# Patient Record
Sex: Male | Born: 1950
Health system: Southern US, Community
[De-identification: ages and names within clinical notes are randomized; demographics above are authoritative.]

## PROBLEM LIST (undated history)

## (undated) DIAGNOSIS — E119 Type 2 diabetes mellitus without complications: Secondary | ICD-10-CM

## (undated) DIAGNOSIS — I10 Essential (primary) hypertension: Secondary | ICD-10-CM

## (undated) DIAGNOSIS — E785 Hyperlipidemia, unspecified: Secondary | ICD-10-CM

## (undated) HISTORY — DX: Hyperlipidemia, unspecified: E78.5

## (undated) HISTORY — DX: Essential (primary) hypertension: I10

## (undated) HISTORY — DX: Type 2 diabetes mellitus without complications: E11.9

---

## 2000-12-21 ENCOUNTER — Ambulatory Visit (HOSPITAL_COMMUNITY): Admission: RE | Admit: 2000-12-21 | Discharge: 2000-12-21 | Payer: Self-pay | Admitting: Gastroenterology

## 2005-08-01 ENCOUNTER — Encounter: Admission: RE | Admit: 2005-08-01 | Discharge: 2005-08-01 | Payer: Self-pay | Admitting: Internal Medicine

## 2007-05-26 IMAGING — CT CT PELVIS W/O CM
2 of 4 series · 13 of 32 positions shown, 18 images · IV contrast (agent unspecified)
Comparison: none

CLINICAL DATA: Flank pain and hematuria, assess for renal stones. 
 CT ABDOMEN WITHOUT CONTRAST:
TECHNIQUE: Multidetector CT imaging of the abdomen was performed following the standard protocol without IV contrast.
TECHNIQUE: Multiplanar, multisequence MR imaging of the pelvis was performed following the standard protocol.  No intravenous contrast was administered.

[Series 2: renal stone · axial · 0.65mm/px · z∈[-256,-36]mm · 5 of 66 slices shown, 10 images]
[im 11/66  soft-tissue]
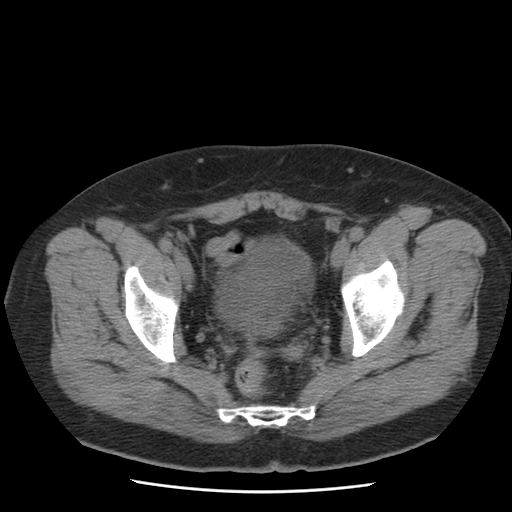
[im 11/66  bone]
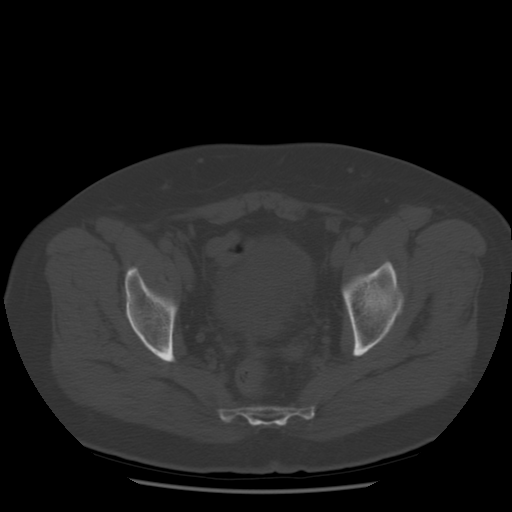
[im 22/66  soft-tissue]
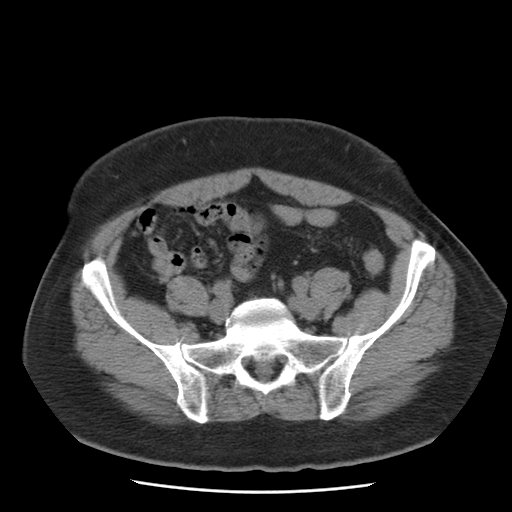
[im 22/66  lung]
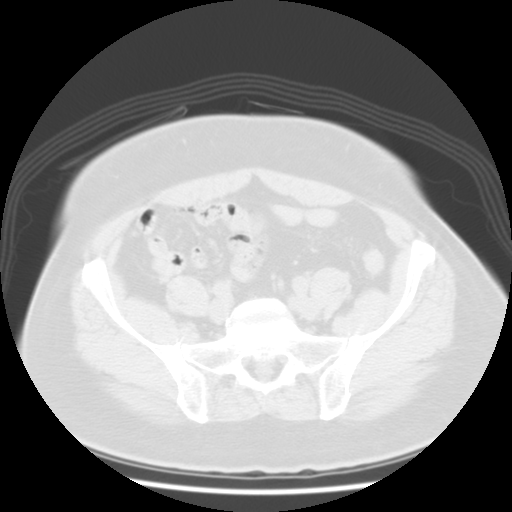
[im 33/66  soft-tissue]
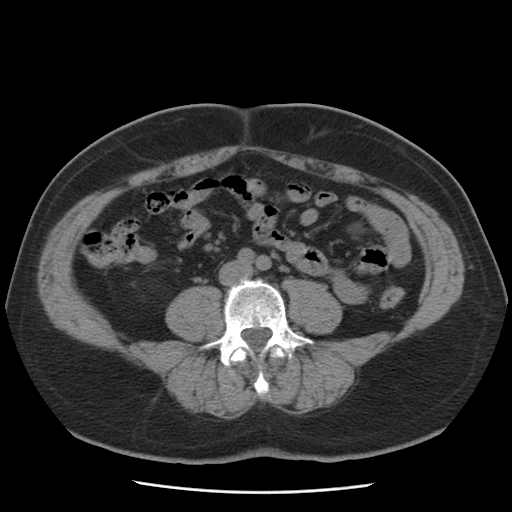
[im 33/66  lung]
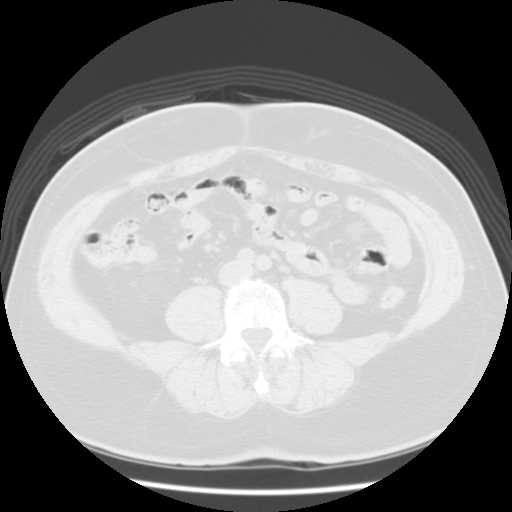
[im 44/66  soft-tissue]
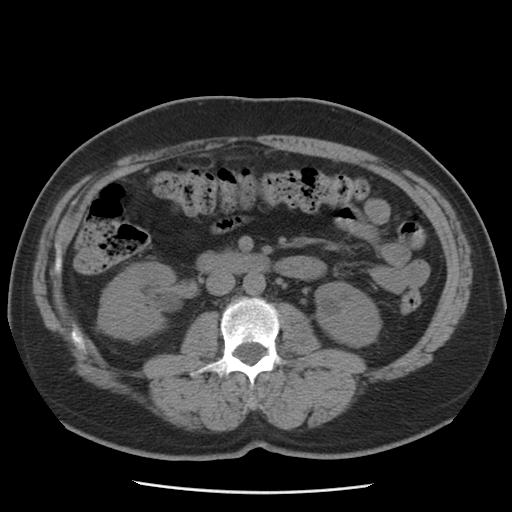
[im 44/66  lung]
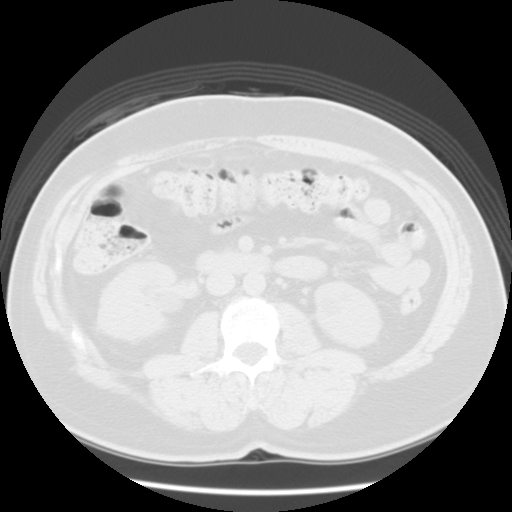
[im 55/66  soft-tissue]
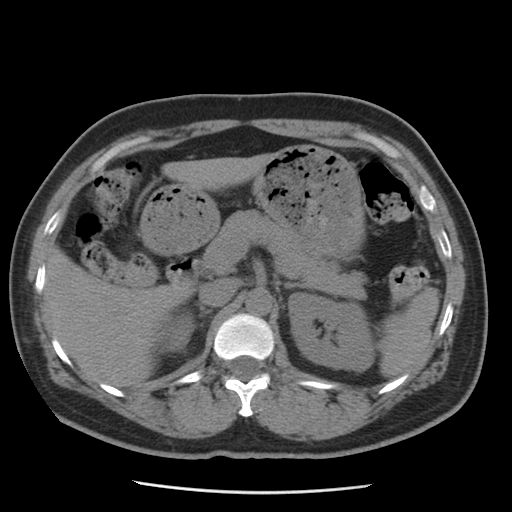
[im 55/66  lung]
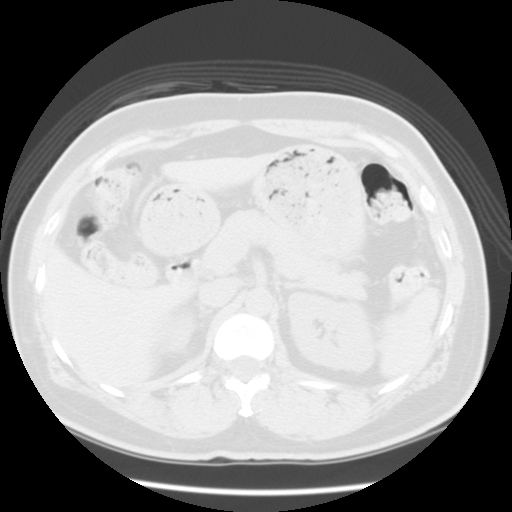

[Series 103: reformatted · sagittal · 0.65mm/px · 8 of 118 slices shown]
[im 10/118  soft-tissue]
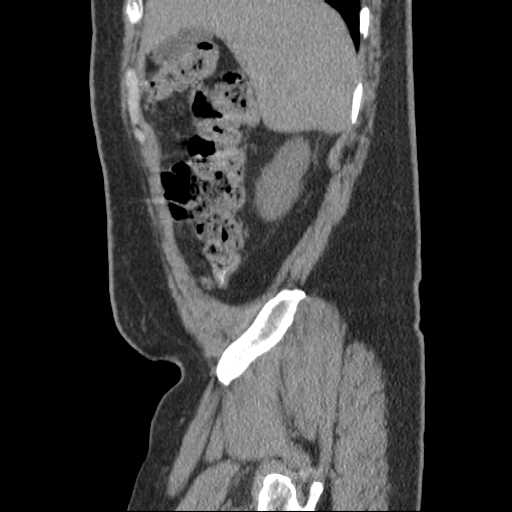
[im 30/118  soft-tissue]
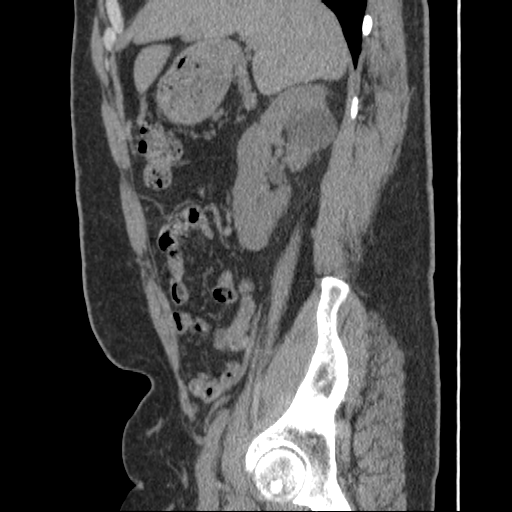
[im 40/118  soft-tissue]
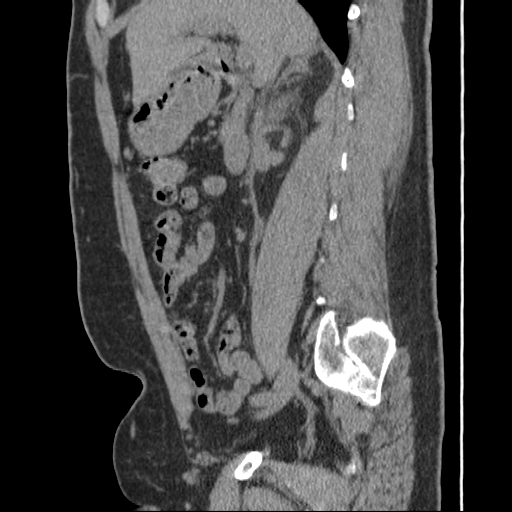
[im 49/118  soft-tissue]
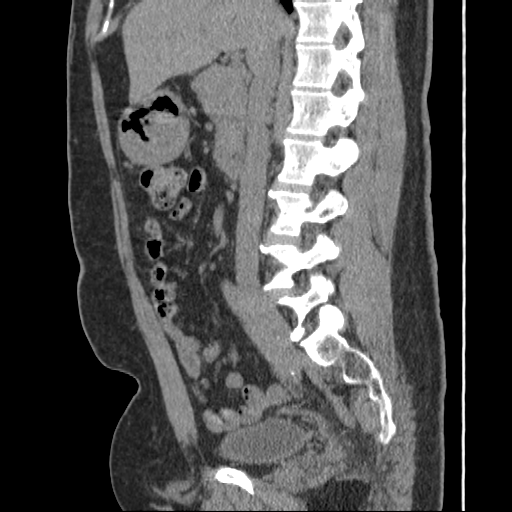
[im 69/118  soft-tissue]
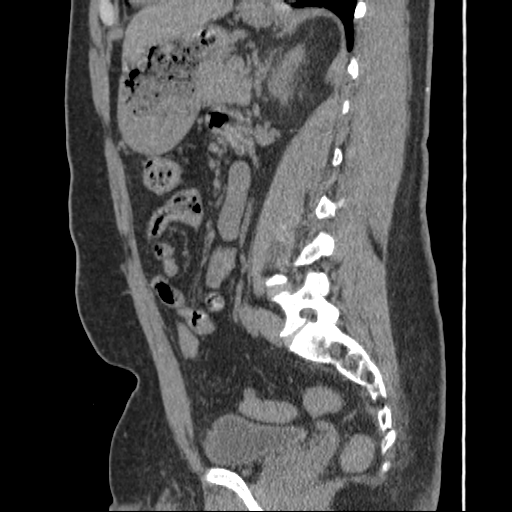
[im 79/118  soft-tissue]
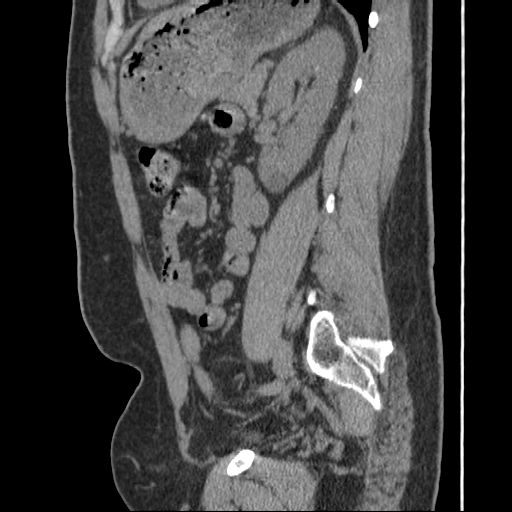
[im 88/118  soft-tissue]
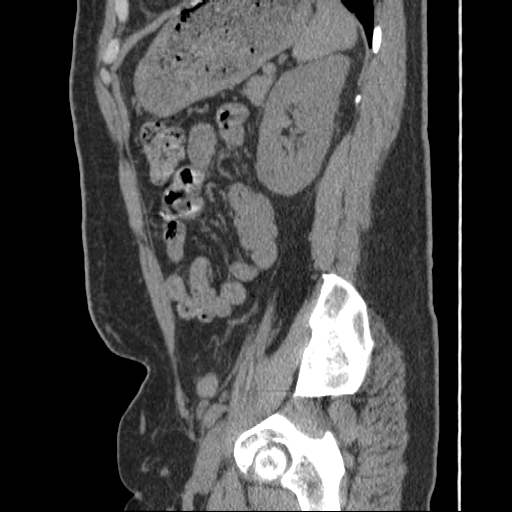
[im 108/118  soft-tissue]
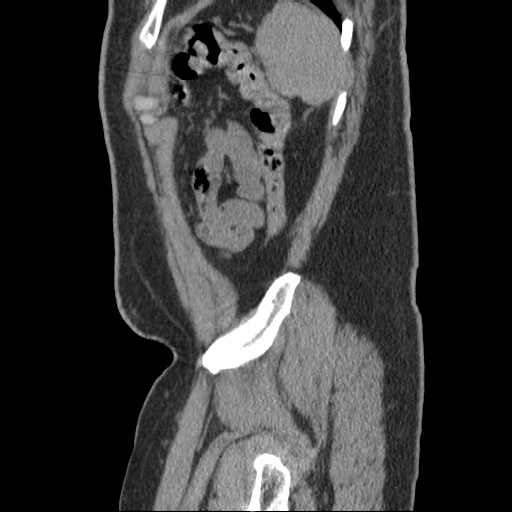

[13 of 32 positions shown; findings below may reference images not displayed]

FINDINGS: The visualized lung bases are clear.  The portions of the liver imaged appear normal without contrast.  No calcified stones.  The spleen is normal.  The pancreas is normal.  The adrenal glands are normal.  The right kidney contains a 3.6cm low density area in the medial cortex that is presumed to be a cyst.  The left kidney contains a 3.3cm low density area in the lateral cortex and a 16mm area in the lower pole presumed to be cysts.  No renal stone disease.  No hydronephrosis.  The aorta and IVC and normal.  No retroperitoneal mass or adenopathy.  No free intraperitoneal fluid or air.  No bowel pathology.
IMPRESSION: 1.  No evidence of renal stone disease.  
 2.  Low density areas in the kidneys presumed to be cysts.  These are not fully characterized without the benefit of contrast administration. 
 CT PELVIS WITHOUT CONTRAST:
FINDINGS: The bladder, prostate gland, and seminal vesicles appear unremarkable.  No evidence of stone disease.  No mass or adenopathy.  No bowel pathology is seen.
IMPRESSION: Negative CT scan of the pelvis.

## 2017-05-26 ENCOUNTER — Other Ambulatory Visit: Payer: Self-pay | Admitting: Internal Medicine

## 2017-05-26 DIAGNOSIS — F17200 Nicotine dependence, unspecified, uncomplicated: Secondary | ICD-10-CM

## 2017-09-25 DIAGNOSIS — H2513 Age-related nuclear cataract, bilateral: Secondary | ICD-10-CM | POA: Diagnosis not present

## 2017-09-25 DIAGNOSIS — E119 Type 2 diabetes mellitus without complications: Secondary | ICD-10-CM | POA: Diagnosis not present

## 2017-10-30 DIAGNOSIS — L303 Infective dermatitis: Secondary | ICD-10-CM | POA: Diagnosis not present

## 2018-02-21 DIAGNOSIS — E119 Type 2 diabetes mellitus without complications: Secondary | ICD-10-CM | POA: Diagnosis not present

## 2018-09-17 DIAGNOSIS — E119 Type 2 diabetes mellitus without complications: Secondary | ICD-10-CM | POA: Diagnosis not present

## 2018-09-17 DIAGNOSIS — H2513 Age-related nuclear cataract, bilateral: Secondary | ICD-10-CM | POA: Diagnosis not present

## 2018-10-25 DIAGNOSIS — Z20828 Contact with and (suspected) exposure to other viral communicable diseases: Secondary | ICD-10-CM | POA: Diagnosis not present

## 2018-11-14 DIAGNOSIS — K219 Gastro-esophageal reflux disease without esophagitis: Secondary | ICD-10-CM | POA: Diagnosis not present

## 2018-11-14 DIAGNOSIS — K649 Unspecified hemorrhoids: Secondary | ICD-10-CM | POA: Diagnosis not present

## 2018-11-14 DIAGNOSIS — Z1211 Encounter for screening for malignant neoplasm of colon: Secondary | ICD-10-CM | POA: Diagnosis not present

## 2018-12-13 DIAGNOSIS — E119 Type 2 diabetes mellitus without complications: Secondary | ICD-10-CM | POA: Diagnosis not present

## 2018-12-13 DIAGNOSIS — Z20828 Contact with and (suspected) exposure to other viral communicable diseases: Secondary | ICD-10-CM | POA: Diagnosis not present

## 2018-12-18 DIAGNOSIS — K219 Gastro-esophageal reflux disease without esophagitis: Secondary | ICD-10-CM | POA: Diagnosis not present

## 2018-12-18 DIAGNOSIS — K208 Other esophagitis: Secondary | ICD-10-CM | POA: Diagnosis not present

## 2018-12-18 DIAGNOSIS — Z1211 Encounter for screening for malignant neoplasm of colon: Secondary | ICD-10-CM | POA: Diagnosis not present

## 2018-12-18 DIAGNOSIS — K228 Other specified diseases of esophagus: Secondary | ICD-10-CM | POA: Diagnosis not present

## 2018-12-18 DIAGNOSIS — K317 Polyp of stomach and duodenum: Secondary | ICD-10-CM | POA: Diagnosis not present

## 2018-12-18 DIAGNOSIS — K2970 Gastritis, unspecified, without bleeding: Secondary | ICD-10-CM | POA: Diagnosis not present

## 2019-03-28 DIAGNOSIS — Z03818 Encounter for observation for suspected exposure to other biological agents ruled out: Secondary | ICD-10-CM | POA: Diagnosis not present

## 2019-07-11 DIAGNOSIS — E119 Type 2 diabetes mellitus without complications: Secondary | ICD-10-CM | POA: Diagnosis not present

## 2019-09-18 DIAGNOSIS — H2513 Age-related nuclear cataract, bilateral: Secondary | ICD-10-CM | POA: Diagnosis not present

## 2019-09-18 DIAGNOSIS — E119 Type 2 diabetes mellitus without complications: Secondary | ICD-10-CM | POA: Diagnosis not present

## 2019-09-18 DIAGNOSIS — H524 Presbyopia: Secondary | ICD-10-CM | POA: Diagnosis not present

## 2019-10-09 ENCOUNTER — Encounter (INDEPENDENT_AMBULATORY_CARE_PROVIDER_SITE_OTHER): Payer: Self-pay | Admitting: Internal Medicine

## 2019-10-23 ENCOUNTER — Encounter (INDEPENDENT_AMBULATORY_CARE_PROVIDER_SITE_OTHER): Payer: Self-pay | Admitting: Internal Medicine

## 2019-11-04 ENCOUNTER — Encounter (INDEPENDENT_AMBULATORY_CARE_PROVIDER_SITE_OTHER): Payer: Self-pay

## 2019-11-04 ENCOUNTER — Encounter (INDEPENDENT_AMBULATORY_CARE_PROVIDER_SITE_OTHER): Payer: Self-pay | Admitting: Internal Medicine

## 2019-11-04 ENCOUNTER — Ambulatory Visit (INDEPENDENT_AMBULATORY_CARE_PROVIDER_SITE_OTHER): Payer: Medicare Other | Admitting: Internal Medicine

## 2019-11-04 ENCOUNTER — Other Ambulatory Visit: Payer: Self-pay

## 2019-11-04 VITALS — BP 140/79 | HR 97 | Temp 98.0°F | Ht 62.0 in | Wt 136.2 lb

## 2019-11-04 DIAGNOSIS — E119 Type 2 diabetes mellitus without complications: Secondary | ICD-10-CM | POA: Diagnosis not present

## 2019-11-04 NOTE — Progress Notes (Signed)
Metrics: Intervention Frequency ACO  Documented Smoking Status Yearly  Screened one or more times in 24 months  Cessation Counseling or  Active cessation medication Past 24 months  Past 24 months   Guideline developer: UpToDate (See UpToDate for funding source) Date Released: 2014       Wellness Office Visit  Subjective:  Patient ID: Nicolas Krause, male    DOB: [birth year]  Age: 69 y.o. MRN: [id number]  CC: This 69 year old man comes in for full evaluation of his multiple medical problems. HPI  He has type 2 diabetes for the last 30 years or so.  He controls this with a combination of insulin, Jardiance and Ozempic. He has had blood work done recently and his most recent hemoglobin A1c was 6.3%. He feels reasonably well.  He does not exercise on a regular basis. He describes fatigue, decreased sense of wellbeing. Past Medical History:  Diagnosis Date  . Diabetes mellitus without complication (HCC)    Type 2 since age 57 yrs   History reviewed. No pertinent surgical history.   Family History  Problem Relation Age of Onset  . Congestive Heart Failure Mother   . Heart disease Father   . Heart disease Sister   . Heart disease Brother   . 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   Social History   Tobacco Use  . Smoking status: Never Smoker  . Smokeless tobacco: Never Used  Substance Use Topics  . Alcohol use: Not on file    Comment: occ    Current Meds  Medication Sig  . Cholecalciferol (VITAMIN D3) 25 MCG (1000 UT) CAPS Take 1 capsule by mouth daily.  . empagliflozin (JARDIANCE) 25 MG TABS tablet Take 25 mg by mouth daily.  . insulin degludec (TRESIBA FLEXTOUCH) 100 UNIT/ML FlexTouch Pen Inject 70 Units into the skin daily.  . Semaglutide (OZEMPIC, 0.25 OR 0.5 MG/DOSE, [place]) Inject 25 mg into the skin once a week.      No flowsheet data found.   Objective:   Today's Vitals: BP 140/79 (BP  Location: Right Arm, Patient Position: Sitting, Cuff Size: Normal)   Pulse 97   Temp 98 F (36.7 C) (Temporal)   Ht 5\' 2"  (1.575 m)   Wt 136 lb 3.2 oz (61.8 kg)   SpO2 98%   BMI 24.91 kg/m  Vitals with BMI 11/04/2019  Height 5\' 2"   Weight 136 lbs 3 oz  BMI 24.91  Systolic 140  Diastolic 79  Pulse 97     Physical Exam  Body composition scan shows extremely poor muscle mass below the normal range in all muscle groups.  33% body fat.  Total body water around 47%.  He looks systemically well.  Heart sounds are present without murmurs or added sounds.  No carotid bruits.  Lung fields are clear.  Abdomen soft nontender.  No hepatosplenomegaly.  No focal neurological signs.  Alert and orientated . Musculoskeletal: No abnormalities.   Assessment   1. Diabetes mellitus without complication (HCC)       Tests ordered No orders of the defined types were placed in this encounter.    Plan: 1. I reviewed his blood work that he presented to me.  Continue with current medications. 2. After discussion, we agreed that he may benefit from testosterone therapy for his overall wellbeing, loss of muscle mass that was documented on body composition scan.  I have called in testosterone cream  100 mg applied twice a day to scrotal skin. 3. He will follow-up in the next few months to see how he is doing.  I spent 40 minutes with this patient discussing his overall health, full examination and body composition scanning.   No orders of the defined types were placed in this encounter.   Wilson Singer, MD

## 2020-02-05 DIAGNOSIS — Z23 Encounter for immunization: Secondary | ICD-10-CM | POA: Diagnosis not present

## 2020-02-20 DIAGNOSIS — E119 Type 2 diabetes mellitus without complications: Secondary | ICD-10-CM | POA: Diagnosis not present

## 2020-06-30 ENCOUNTER — Other Ambulatory Visit (INDEPENDENT_AMBULATORY_CARE_PROVIDER_SITE_OTHER): Payer: Self-pay | Admitting: Internal Medicine

## 2020-06-30 MED ORDER — TESTOSTERONE 20 % CREA: 100.0000 mg | TOPICAL_CREAM | Freq: Every day | 0 refills | Status: DC

## 2020-07-14 DIAGNOSIS — E119 Type 2 diabetes mellitus without complications: Secondary | ICD-10-CM | POA: Diagnosis not present

## 2020-09-14 ENCOUNTER — Telehealth (INDEPENDENT_AMBULATORY_CARE_PROVIDER_SITE_OTHER): Payer: Self-pay | Admitting: Internal Medicine

## 2020-09-14 NOTE — Telephone Encounter (Signed)
Spoke with patient to schedule Medicare Annual Wellness Visit (AWV) either virtually or in office.  Patient stated he will call back to schedule out of town right now   awvi 08/07/18 per [facility]  please schedule at anytime with [facility] health coach  This should be a 45 minute visit.

## 2020-09-23 DIAGNOSIS — H524 Presbyopia: Secondary | ICD-10-CM | POA: Diagnosis not present

## 2020-09-23 DIAGNOSIS — E119 Type 2 diabetes mellitus without complications: Secondary | ICD-10-CM | POA: Diagnosis not present

## 2020-09-23 LAB — HM DIABETES EYE EXAM

## 2020-11-01 DIAGNOSIS — E119 Type 2 diabetes mellitus without complications: Secondary | ICD-10-CM | POA: Diagnosis not present

## 2021-01-27 DIAGNOSIS — Z23 Encounter for immunization: Secondary | ICD-10-CM | POA: Diagnosis not present

## 2021-05-13 ENCOUNTER — Ambulatory Visit (INDEPENDENT_AMBULATORY_CARE_PROVIDER_SITE_OTHER): Payer: Medicare Other

## 2021-05-13 ENCOUNTER — Ambulatory Visit (INDEPENDENT_AMBULATORY_CARE_PROVIDER_SITE_OTHER): Payer: Medicare Other | Admitting: Orthopedic Surgery

## 2021-05-13 ENCOUNTER — Other Ambulatory Visit: Payer: Self-pay

## 2021-05-13 DIAGNOSIS — M25561 Pain in right knee: Secondary | ICD-10-CM

## 2021-05-17 ENCOUNTER — Encounter: Payer: Self-pay | Admitting: Orthopedic Surgery

## 2021-05-17 NOTE — Progress Notes (Signed)
Office Visit Note   Patient: Nicolas Krause           Date of Birth: 01/12/1951           MRN: [id number] Visit Date: 05/13/2021 Requested by: No referring provider defined for this encounter. PCP: Wilson Singer, MD (Inactive)  Subjective: Chief Complaint  Patient presents with   Right Knee - Pain    HPI: Nicolas Krause is a 71 year old patient with right knee pain.  He fell 16 days ago when he missed the last step and landed on his knee.  He does not report too much swelling or weakness.  Reports a constant dull ache.  Ibuprofen has been helpful but he has gastroesophageal reflux disease.  Denies any groin pain.  Denies any new mechanical symptoms in the knee.  Stairs are difficult for him.  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  Dull leg goes up and down the region of the knee.  Patient is diabetic.              ROS: All systems reviewed are negative as they relate to the chief complaint within the history of present illness.  Patient denies  fevers or chills.   Assessment & Plan: Visit Diagnoses:  1. Right knee pain, unspecified chronicity     Plan: Impression is proximal tibial fracture with fracture line extending not across the full proximal tibia but from lateral just to the medial aspect of the inferior tibial tubercle.  While he has been walking on this and has put a pretty good functional test on the stability of the fracture at this time.  Not too much swelling around the knee.  I think this should be a self-limited injury but he may have delayed healing due to his diabetes.  Recommend minimal weightbearing as possible on the right leg but okay to continue to weight-bear as long as the pain does not increase.  He will need repeat radiographs to assess fracture healing in 2 weeks.  Again because he has already put this fracture through a fairly rigorous functional test I do not think immobilization is indicated at this time.  Follow-Up Instructions: No  follow-ups on file.   Orders:  Orders Placed This Encounter  Procedures   XR Knee 1-2 Views Right   No orders of the defined types were placed in this encounter.     Procedures: No procedures performed   Clinical Data: No additional findings.  Objective: Vital Signs: There were no vitals taken for this visit.  Physical Exam:   Constitutional: Patient appears well-developed HEENT:  Head: Normocephalic Eyes:EOM are normal Neck: Normal range of motion Cardiovascular: Normal rate Pulmonary/chest: Effort normal Neurologic: Patient is alert Skin: Skin is warm Psychiatric: Patient has normal mood and affect   Ortho Exam: Ortho exam demonstrates full range of motion of that right knee.  No effusion.  Does have some proximal tibial tenderness.  Ankle dorsiflexion intact.  No groin pain with internal ex rotation of the leg.  Not too much ecchymosis or bruising or swelling around the right leg compared to the left leg.  Patient does have an antalgic gait to the right.  Very subtle.  Specialty Comments:  No specialty comments available.  Imaging: No results found.   PMFS History: There are no problems to display for this patient.  Past Medical History:  Diagnosis Date   Diabetes mellitus without complication (HCC)    Type 2 since age 9  yrs    Family History  Problem Relation Age of Onset   Congestive Heart Failure Mother    Heart disease Father    Heart disease Sister    Heart disease Brother    Diabetes Sister     No past surgical history on file. Social History   Occupational History   Not on file  Tobacco Use   Smoking status: Never   Smokeless tobacco: Never  Substance and Sexual Activity   Alcohol use: Not on file    Comment: occ   Drug use: Never   Sexual activity: Not on file

## 2021-05-30 ENCOUNTER — Ambulatory Visit (INDEPENDENT_AMBULATORY_CARE_PROVIDER_SITE_OTHER): Payer: Medicare Other | Admitting: Orthopedic Surgery

## 2021-05-30 ENCOUNTER — Encounter: Payer: Self-pay | Admitting: Orthopedic Surgery

## 2021-05-30 ENCOUNTER — Ambulatory Visit (INDEPENDENT_AMBULATORY_CARE_PROVIDER_SITE_OTHER): Payer: Medicare Other

## 2021-05-30 ENCOUNTER — Other Ambulatory Visit: Payer: Self-pay

## 2021-05-30 DIAGNOSIS — M25561 Pain in right knee: Secondary | ICD-10-CM

## 2021-05-30 NOTE — Progress Notes (Signed)
Office Visit Note   Patient: Nicolas Krause           Date of Birth: 02-05-1951           MRN: [id number] Visit Date: 05/30/2021 Requested by: No referring provider defined for this encounter. 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  This was sustained about a month ago.  He has been walking.  Overall it has improved since clinic visit 2 weeks ago.  Still has to navigate stairs in on tandem fashion.              ROS: All systems reviewed are negative as they relate to the chief complaint within the history of present illness.  Patient denies  fevers or chills.   Assessment & Plan: Visit Diagnoses:  1. Right knee pain, unspecified chronicity     Plan: Impression is healing fracture of the proximal tibial region without deformity.  Some callus formation is present.  Plan is continue with weightbearing as tolerated but no running or jumping on that right leg until he can go up and down stairs without pain.  Callus formation present today is encouraging particular given the patient's history of diabetes.  Overall I think his knee will continue to heal fully by 2 more weeks.  We will see him back as needed  Follow-Up Instructions: Return if symptoms worsen or fail to improve.   Orders:  Orders Placed This Encounter  Procedures   XR Knee 1-2 Views Right   No orders of the defined types were placed in this encounter.     Procedures: No procedures performed   Clinical Data: No additional findings.  Objective: Vital Signs: There were no vitals taken for this visit.  Physical Exam:   Constitutional: Patient appears well-developed HEENT:  Head: Normocephalic Eyes:EOM are normal Neck: Normal range of motion Cardiovascular: Normal rate Pulmonary/chest: Effort  normal Neurologic: Patient is alert Skin: Skin is warm Psychiatric: Patient has normal mood and affect   Ortho Exam: Ortho exam demonstrates no real tenderness or swelling in the proximal lateral tibial region on the right-hand side.  Does have mild tenderness just proximal to the tibial tubercle region but no swelling in that area.  Extensor mechanism intact.  No knee effusion bilaterally.  Gait is normal with no limp today compared to 2 weeks ago.  Specialty Comments:  No specialty comments available.  Imaging: XR Knee 1-2 Views Right  Result Date: 05/30/2021 AP lateral radiographs right knee reviewed.  Linear proximal tibial fracture is less well-visualized compared to radiographs from 2 weeks ago.  Some callus formation is present around this transverse fracture line.  No acute injury no deformity    PMFS History: There are no problems to display for this patient.  Past Medical History:  Diagnosis Date   Diabetes mellitus without complication (HCC)    Type 2 since age 25 yrs    Family History  Problem Relation Age of Onset   Congestive Heart Failure Mother    Heart disease Father    Heart disease Sister    Heart disease Brother    Diabetes Sister     History reviewed. No pertinent surgical history. Social History   Occupational History   Not on file  Tobacco Use   Smoking status: Never   Smokeless  tobacco: Never  Substance and Sexual Activity   Alcohol use: Not on file    Comment: occ   Drug use: Never   Sexual activity: Not on file

## 2021-08-29 DIAGNOSIS — Z1159 Encounter for screening for other viral diseases: Secondary | ICD-10-CM | POA: Diagnosis not present

## 2021-09-08 DIAGNOSIS — Z1159 Encounter for screening for other viral diseases: Secondary | ICD-10-CM | POA: Diagnosis not present

## 2021-09-27 DIAGNOSIS — E113393 Type 2 diabetes mellitus with moderate nonproliferative diabetic retinopathy without macular edema, bilateral: Secondary | ICD-10-CM | POA: Diagnosis not present

## 2021-09-27 DIAGNOSIS — H52203 Unspecified astigmatism, bilateral: Secondary | ICD-10-CM | POA: Diagnosis not present

## 2022-01-01 ENCOUNTER — Inpatient Hospital Stay (HOSPITAL_COMMUNITY): Payer: Medicare Other

## 2022-01-01 ENCOUNTER — Encounter (HOSPITAL_COMMUNITY)
Admission: EM | Disposition: A | Discharge status: Home / Self Care | Payer: Self-pay | Source: Home / Self Care | Attending: Cardiology

## 2022-01-01 ENCOUNTER — Encounter (HOSPITAL_COMMUNITY): Payer: Self-pay

## 2022-01-01 ENCOUNTER — Inpatient Hospital Stay (HOSPITAL_COMMUNITY)
Admission: EM | Admit: 2022-01-01 | Discharge: 2022-01-02 | DRG: 246 | Disposition: A | Discharge status: Home / Self Care | Payer: Medicare Other | Attending: Cardiology | Admitting: Cardiology

## 2022-01-01 ENCOUNTER — Other Ambulatory Visit: Payer: Self-pay

## 2022-01-01 DIAGNOSIS — I249 Acute ischemic heart disease, unspecified: Secondary | ICD-10-CM | POA: Diagnosis not present

## 2022-01-01 DIAGNOSIS — E119 Type 2 diabetes mellitus without complications: Secondary | ICD-10-CM | POA: Diagnosis not present

## 2022-01-01 DIAGNOSIS — Z833 Family history of diabetes mellitus: Secondary | ICD-10-CM

## 2022-01-01 DIAGNOSIS — I5021 Acute systolic (congestive) heart failure: Secondary | ICD-10-CM | POA: Diagnosis present

## 2022-01-01 DIAGNOSIS — I2102 ST elevation (STEMI) myocardial infarction involving left anterior descending coronary artery: Principal | ICD-10-CM | POA: Diagnosis present

## 2022-01-01 DIAGNOSIS — Z794 Long term (current) use of insulin: Secondary | ICD-10-CM | POA: Diagnosis not present

## 2022-01-01 DIAGNOSIS — I5020 Unspecified systolic (congestive) heart failure: Secondary | ICD-10-CM | POA: Diagnosis not present

## 2022-01-01 DIAGNOSIS — Z880 Allergy status to penicillin: Secondary | ICD-10-CM | POA: Diagnosis not present

## 2022-01-01 DIAGNOSIS — Z20822 Contact with and (suspected) exposure to covid-19: Secondary | ICD-10-CM | POA: Diagnosis present

## 2022-01-01 DIAGNOSIS — N1832 Chronic kidney disease, stage 3b: Secondary | ICD-10-CM | POA: Diagnosis not present

## 2022-01-01 DIAGNOSIS — Z8249 Family history of ischemic heart disease and other diseases of the circulatory system: Secondary | ICD-10-CM

## 2022-01-01 DIAGNOSIS — I213 ST elevation (STEMI) myocardial infarction of unspecified site: Secondary | ICD-10-CM | POA: Diagnosis not present

## 2022-01-01 DIAGNOSIS — I2510 Atherosclerotic heart disease of native coronary artery without angina pectoris: Secondary | ICD-10-CM | POA: Diagnosis not present

## 2022-01-01 DIAGNOSIS — Z955 Presence of coronary angioplasty implant and graft: Secondary | ICD-10-CM

## 2022-01-01 DIAGNOSIS — Z79899 Other long term (current) drug therapy: Secondary | ICD-10-CM

## 2022-01-01 DIAGNOSIS — Z7984 Long term (current) use of oral hypoglycemic drugs: Secondary | ICD-10-CM

## 2022-01-01 DIAGNOSIS — E1122 Type 2 diabetes mellitus with diabetic chronic kidney disease: Secondary | ICD-10-CM | POA: Diagnosis present

## 2022-01-01 HISTORY — PX: LEFT HEART CATH AND CORONARY ANGIOGRAPHY: CATH118249

## 2022-01-01 HISTORY — PX: CORONARY/GRAFT ACUTE MI REVASCULARIZATION: CATH118305

## 2022-01-01 LAB — COMPREHENSIVE METABOLIC PANEL
ALT: 38 U/L (ref 0–44)
AST: 32 U/L (ref 15–41)
Albumin: 3.9 g/dL (ref 3.5–5.0)
Alkaline Phosphatase: 60 U/L (ref 38–126)
Anion gap: 8 (ref 5–15)
BUN: 32 mg/dL — ABNORMAL HIGH (ref 8–23)
CO2: 23 mmol/L (ref 22–32)
Calcium: 9.5 mg/dL (ref 8.9–10.3)
Chloride: 106 mmol/L (ref 98–111)
Creatinine, Ser: 1.79 mg/dL — ABNORMAL HIGH (ref 0.61–1.24)
GFR, Estimated: 40 mL/min — ABNORMAL LOW (ref >60)
Glucose, Bld: 258 mg/dL — ABNORMAL HIGH (ref 70–99)
Potassium: 4.7 mmol/L (ref 3.5–5.1)
Sodium: 137 mmol/L (ref 135–145)
Total Bilirubin: 0.4 mg/dL (ref 0.3–1.2)
Total Protein: 7.1 g/dL (ref 6.5–8.1)

## 2022-01-01 LAB — ECHOCARDIOGRAM COMPLETE
Height: 62 in
S' Lateral: 3.5 cm
Single Plane A4C EF: 21.6 %
Weight: 1856 oz

## 2022-01-01 LAB — CBC
HCT: 41.6 % (ref 39.0–52.0)
Hemoglobin: 15.1 g/dL (ref 13.0–17.0)
MCH: 31.7 pg (ref 26.0–34.0)
MCHC: 36.3 g/dL — ABNORMAL HIGH (ref 30.0–36.0)
MCV: 87.4 fL (ref 80.0–100.0)
Platelets: 230 10*3/uL (ref 150–400)
RBC: 4.76 MIL/uL (ref 4.22–5.81)
RDW: 12.5 % (ref 11.5–15.5)
WBC: 6.9 10*3/uL (ref 4.0–10.5)
nRBC: 0 % (ref 0.0–0.2)

## 2022-01-01 LAB — HEMOGLOBIN A1C
Hgb A1c MFr Bld: 5 % (ref 4.8–5.6)
Mean Plasma Glucose: 96.8 mg/dL

## 2022-01-01 LAB — BRAIN NATRIURETIC PEPTIDE: B Natriuretic Peptide: 18.4 pg/mL (ref 0.0–100.0)

## 2022-01-01 LAB — APTT: aPTT: 24 seconds (ref 24–36)

## 2022-01-01 LAB — GLUCOSE, CAPILLARY
Glucose-Capillary: 190 mg/dL — ABNORMAL HIGH (ref 70–99)
Glucose-Capillary: 109 mg/dL — ABNORMAL HIGH (ref 70–99)
Glucose-Capillary: 147 mg/dL — ABNORMAL HIGH (ref 70–99)

## 2022-01-01 LAB — POCT ACTIVATED CLOTTING TIME
Activated Clotting Time: 239 seconds
Activated Clotting Time: 269 seconds

## 2022-01-01 LAB — TROPONIN I (HIGH SENSITIVITY)
Troponin I (High Sensitivity): 163 ng/L (ref <18)
Troponin I (High Sensitivity): 11940 ng/L (ref <18)

## 2022-01-01 LAB — I-STAT CHEM 8, ED
BUN: 34 mg/dL — ABNORMAL HIGH (ref 8–23)
Calcium, Ion: 1.21 mmol/L (ref 1.15–1.40)
Chloride: 106 mmol/L (ref 98–111)
Creatinine, Ser: 1.8 mg/dL — ABNORMAL HIGH (ref 0.61–1.24)
Glucose, Bld: 266 mg/dL — ABNORMAL HIGH (ref 70–99)
HCT: 42 % (ref 39.0–52.0)
Hemoglobin: 14.3 g/dL (ref 13.0–17.0)
Potassium: 4.4 mmol/L (ref 3.5–5.1)
Sodium: 140 mmol/L (ref 135–145)
TCO2: 25 mmol/L (ref 22–32)

## 2022-01-01 LAB — LIPID PANEL
Cholesterol: 136 mg/dL (ref 0–200)
HDL: 45 mg/dL (ref >40)
LDL Cholesterol: 58 mg/dL (ref 0–99)
Total CHOL/HDL Ratio: 3 RATIO
Triglycerides: 166 mg/dL — ABNORMAL HIGH (ref <150)
VLDL: 33 mg/dL (ref 0–40)

## 2022-01-01 LAB — RESP PANEL BY RT-PCR (FLU A&B, COVID) ARPGX2
Influenza A by PCR: NEGATIVE
Influenza B by PCR: NEGATIVE
SARS Coronavirus 2 by RT PCR: NEGATIVE

## 2022-01-01 LAB — MRSA NEXT GEN BY PCR, NASAL: MRSA by PCR Next Gen: NOT DETECTED

## 2022-01-01 LAB — PROTIME-INR
INR: 1 (ref 0.8–1.2)
Prothrombin Time: 12.8 seconds (ref 11.4–15.2)

## 2022-01-01 SURGERY — CORONARY/GRAFT ACUTE MI REVASCULARIZATION
Anesthesia: LOCAL

## 2022-01-01 MED ORDER — CHLORHEXIDINE GLUCONATE CLOTH 2 % EX PADS
6.0000 | MEDICATED_PAD | Freq: Every day | CUTANEOUS | Status: DC
  Administered 2022-01-02: 6 via TOPICAL

## 2022-01-01 MED ORDER — INSULIN ASPART 100 UNIT/ML IJ SOLN
3.0000 [IU] | Freq: Three times a day (TID) | INTRAMUSCULAR | Status: DC
  Administered 2022-01-01 – 2022-01-02 (×3): 3 [IU] via SUBCUTANEOUS

## 2022-01-01 MED ORDER — FUROSEMIDE 10 MG/ML IJ SOLN
20.0000 mg | Freq: Once | INTRAMUSCULAR | Status: AC
Start: 2022-01-01 — End: 2022-01-01
  Administered 2022-01-01: 20 mg via INTRAVENOUS
  Filled 2022-01-01: qty 2

## 2022-01-01 MED ORDER — LABETALOL HCL 5 MG/ML IV SOLN: 10.0000 mg | INTRAVENOUS | Status: AC | PRN

## 2022-01-01 MED ORDER — FENTANYL CITRATE (PF) 100 MCG/2ML IJ SOLN
INTRAMUSCULAR | Status: AC
  Filled 2022-01-01: qty 2

## 2022-01-01 MED ORDER — ACETAMINOPHEN 325 MG PO TABS: 650.0000 mg | ORAL_TABLET | ORAL | Status: DC | PRN

## 2022-01-01 MED ORDER — LIDOCAINE HCL (PF) 1 % IJ SOLN
INTRAMUSCULAR | Status: AC
  Filled 2022-01-01: qty 30

## 2022-01-01 MED ORDER — VERAPAMIL HCL 2.5 MG/ML IV SOLN
INTRA_ARTERIAL | Status: DC | PRN
  Administered 2022-01-01: 5 mL via INTRA_ARTERIAL

## 2022-01-01 MED ORDER — SODIUM CHLORIDE 0.9 % IV BOLUS
INTRAVENOUS | Status: AC | PRN
  Administered 2022-01-01: 250 mL via INTRAVENOUS

## 2022-01-01 MED ORDER — HEPARIN SODIUM (PORCINE) 1000 UNIT/ML IJ SOLN
INTRAMUSCULAR | Status: DC | PRN
  Administered 2022-01-01: 3000 [IU] via INTRAVENOUS
  Administered 2022-01-01: 2000 [IU] via INTRAVENOUS
  Administered 2022-01-01: 3000 [IU] via INTRAVENOUS

## 2022-01-01 MED ORDER — NITROGLYCERIN 0.4 MG SL SUBL: 0.4000 mg | SUBLINGUAL_TABLET | SUBLINGUAL | Status: DC | PRN

## 2022-01-01 MED ORDER — ASPIRIN 81 MG PO CHEW: 324.0000 mg | CHEWABLE_TABLET | ORAL | Status: DC

## 2022-01-01 MED ORDER — EMPAGLIFLOZIN 10 MG PO TABS
10.0000 mg | ORAL_TABLET | Freq: Every day | ORAL | Status: DC
  Administered 2022-01-02: 10 mg via ORAL
  Filled 2022-01-01: qty 1

## 2022-01-01 MED ORDER — HEPARIN SODIUM (PORCINE) 5000 UNIT/ML IJ SOLN: 5000.0000 [IU] | Freq: Three times a day (TID) | INTRAMUSCULAR | Status: DC

## 2022-01-01 MED ORDER — LOSARTAN POTASSIUM 25 MG PO TABS
12.5000 mg | ORAL_TABLET | Freq: Every day | ORAL | Status: DC
  Administered 2022-01-01 – 2022-01-02 (×2): 12.5 mg via ORAL
  Filled 2022-01-01 (×2): qty 1

## 2022-01-01 MED ORDER — EMPAGLIFLOZIN 25 MG PO TABS
25.0000 mg | ORAL_TABLET | Freq: Every day | ORAL | Status: DC
  Administered 2022-01-01: 25 mg via ORAL
  Filled 2022-01-01: qty 1

## 2022-01-01 MED ORDER — SODIUM CHLORIDE 0.9 % IV SOLN: INTRAVENOUS | Status: DC

## 2022-01-01 MED ORDER — TICAGRELOR 90 MG PO TABS
ORAL_TABLET | ORAL | Status: AC
Start: 2022-01-01 — End: ?
  Filled 2022-01-01: qty 2

## 2022-01-01 MED ORDER — TICAGRELOR 90 MG PO TABS
ORAL_TABLET | ORAL | Status: DC | PRN
  Administered 2022-01-01: 180 mg via ORAL

## 2022-01-01 MED ORDER — ROSUVASTATIN CALCIUM 5 MG PO TABS
10.0000 mg | ORAL_TABLET | Freq: Every day | ORAL | Status: DC
  Administered 2022-01-01 – 2022-01-02 (×2): 10 mg via ORAL
  Filled 2022-01-01 (×2): qty 2

## 2022-01-01 MED ORDER — METOPROLOL TARTRATE 12.5 MG HALF TABLET: 12.5000 mg | ORAL_TABLET | Freq: Two times a day (BID) | ORAL | Status: DC

## 2022-01-01 MED ORDER — PERFLUTREN LIPID MICROSPHERE
1.0000 mL | INTRAVENOUS | Status: AC | PRN
  Administered 2022-01-01: 4 mL via INTRAVENOUS

## 2022-01-01 MED ORDER — ASPIRIN 81 MG PO CHEW
324.0000 mg | CHEWABLE_TABLET | Freq: Once | ORAL | Status: AC
  Administered 2022-01-01: 324 mg via ORAL
  Filled 2022-01-01: qty 4

## 2022-01-01 MED ORDER — ASPIRIN 300 MG RE SUPP: 300.0000 mg | RECTAL | Status: DC

## 2022-01-01 MED ORDER — ONDANSETRON HCL 4 MG/2ML IJ SOLN: 4.0000 mg | Freq: Four times a day (QID) | INTRAMUSCULAR | Status: DC | PRN

## 2022-01-01 MED ORDER — SODIUM CHLORIDE 0.9 % IV SOLN
INTRAVENOUS | Status: AC
  Administered 2022-01-01: 50 mL via INTRAVENOUS

## 2022-01-01 MED ORDER — ASPIRIN 81 MG PO TBEC
81.0000 mg | DELAYED_RELEASE_TABLET | Freq: Every day | ORAL | Status: DC
  Administered 2022-01-02: 81 mg via ORAL
  Filled 2022-01-01: qty 1

## 2022-01-01 MED ORDER — FUROSEMIDE 10 MG/ML IJ SOLN
INTRAMUSCULAR | Status: DC | PRN
  Administered 2022-01-01: 20 mg via INTRAVENOUS

## 2022-01-01 MED ORDER — FENTANYL CITRATE (PF) 100 MCG/2ML IJ SOLN
INTRAMUSCULAR | Status: DC | PRN
Start: 2022-01-01 — End: 2022-01-01
  Administered 2022-01-01: 25 ug via INTRAVENOUS

## 2022-01-01 MED ORDER — INSULIN GLARGINE-YFGN 100 UNIT/ML ~~LOC~~ SOLN
30.0000 [IU] | Freq: Every day | SUBCUTANEOUS | Status: DC
  Filled 2022-01-01 (×2): qty 0.3

## 2022-01-01 MED ORDER — HEPARIN (PORCINE) IN NACL 1000-0.9 UT/500ML-% IV SOLN
INTRAVENOUS | Status: AC
  Filled 2022-01-01: qty 1000

## 2022-01-01 MED ORDER — HEPARIN SODIUM (PORCINE) 1000 UNIT/ML IJ SOLN
INTRAMUSCULAR | Status: AC
  Filled 2022-01-01: qty 10

## 2022-01-01 MED ORDER — NITROGLYCERIN 1 MG/10 ML FOR IR/CATH LAB
INTRA_ARTERIAL | Status: AC
  Filled 2022-01-01: qty 10

## 2022-01-01 MED ORDER — TICAGRELOR 90 MG PO TABS
90.0000 mg | ORAL_TABLET | Freq: Two times a day (BID) | ORAL | Status: DC
  Administered 2022-01-01 – 2022-01-02 (×2): 90 mg via ORAL
  Filled 2022-01-01 (×2): qty 1

## 2022-01-01 MED ORDER — VERAPAMIL HCL 2.5 MG/ML IV SOLN
INTRAVENOUS | Status: AC
  Filled 2022-01-01: qty 2

## 2022-01-01 MED ORDER — SODIUM CHLORIDE 0.9% FLUSH
3.0000 mL | Freq: Two times a day (BID) | INTRAVENOUS | Status: DC
  Administered 2022-01-01 – 2022-01-02 (×2): 3 mL via INTRAVENOUS

## 2022-01-01 MED ORDER — INSULIN DEGLUDEC 100 UNIT/ML ~~LOC~~ SOPN
70.0000 [IU] | PEN_INJECTOR | Freq: Every day | SUBCUTANEOUS | Status: DC
Start: 2022-01-01 — End: 2022-01-01

## 2022-01-01 MED ORDER — INSULIN ASPART 100 UNIT/ML IJ SOLN
0.0000 [IU] | Freq: Three times a day (TID) | INTRAMUSCULAR | Status: DC
  Administered 2022-01-01 – 2022-01-02 (×2): 2 [IU] via SUBCUTANEOUS
  Administered 2022-01-02: 1 [IU] via SUBCUTANEOUS

## 2022-01-01 MED ORDER — NITROGLYCERIN 1 MG/10 ML FOR IR/CATH LAB
INTRA_ARTERIAL | Status: DC | PRN
  Administered 2022-01-01: 100 ug via INTRACORONARY
  Administered 2022-01-01: 200 ug via INTRACORONARY

## 2022-01-01 MED ORDER — SODIUM CHLORIDE 0.9% FLUSH: 3.0000 mL | INTRAVENOUS | Status: DC | PRN

## 2022-01-01 MED ORDER — HYDRALAZINE HCL 20 MG/ML IJ SOLN: 10.0000 mg | INTRAMUSCULAR | Status: AC | PRN

## 2022-01-01 MED ORDER — HEPARIN SODIUM (PORCINE) 5000 UNIT/ML IJ SOLN
4000.0000 [IU] | Freq: Once | INTRAMUSCULAR | Status: AC
  Administered 2022-01-01: 4000 [IU] via INTRAVENOUS

## 2022-01-01 MED ORDER — FUROSEMIDE 10 MG/ML IJ SOLN
INTRAMUSCULAR | Status: AC
  Filled 2022-01-01: qty 4

## 2022-01-01 MED ORDER — SPIRONOLACTONE 12.5 MG HALF TABLET: 12.5000 mg | ORAL_TABLET | Freq: Every day | ORAL | Status: DC

## 2022-01-01 MED ORDER — MIDAZOLAM HCL 2 MG/2ML IJ SOLN
INTRAMUSCULAR | Status: AC
  Filled 2022-01-01: qty 2

## 2022-01-01 MED ORDER — SODIUM CHLORIDE 0.9 % IV SOLN
INTRAVENOUS | Status: AC | PRN
  Administered 2022-01-01: 10 mL/h via INTRAVENOUS

## 2022-01-01 MED ORDER — ASPIRIN 81 MG PO TBEC: 81.0000 mg | DELAYED_RELEASE_TABLET | Freq: Every day | ORAL | Status: DC

## 2022-01-01 MED ORDER — LIDOCAINE HCL (PF) 1 % IJ SOLN
INTRAMUSCULAR | Status: DC | PRN
  Administered 2022-01-01: 2 mL
  Administered 2022-01-01: 12 mg

## 2022-01-01 MED ORDER — ROSUVASTATIN CALCIUM 20 MG PO TABS: 20.0000 mg | ORAL_TABLET | Freq: Every day | ORAL | Status: DC

## 2022-01-01 MED ORDER — IOHEXOL 350 MG/ML SOLN
INTRAVENOUS | Status: DC | PRN
  Administered 2022-01-01: 100 mL via INTRA_ARTERIAL

## 2022-01-01 MED ORDER — MIDAZOLAM HCL 2 MG/2ML IJ SOLN
INTRAMUSCULAR | Status: DC | PRN
  Administered 2022-01-01: 1 mg via INTRAVENOUS

## 2022-01-01 MED ORDER — HEPARIN SODIUM (PORCINE) 5000 UNIT/ML IJ SOLN
5000.0000 [IU] | Freq: Three times a day (TID) | INTRAMUSCULAR | Status: DC
  Administered 2022-01-01 – 2022-01-02 (×3): 5000 [IU] via SUBCUTANEOUS
  Filled 2022-01-01 (×3): qty 1

## 2022-01-01 MED ORDER — SODIUM CHLORIDE 0.9 % IV SOLN: 250.0000 mL | INTRAVENOUS | Status: DC | PRN

## 2022-01-01 MED ORDER — HEPARIN (PORCINE) IN NACL 1000-0.9 UT/500ML-% IV SOLN
INTRAVENOUS | Status: DC | PRN
  Administered 2022-01-01 (×2): 500 mL

## 2022-01-01 SURGICAL SUPPLY — 29 items
BALL SAPPHIRE NC24 2.50X10 (BALLOONS) ×1
BALL SAPPHIRE NC24 3.0X8 (BALLOONS) ×1
BALLN SAPPHIRE 2.0X15 (BALLOONS) ×1
BALLOON SAPPHIRE 2.0X15 (BALLOONS) IMPLANT
BALLOON SAPPHIRE NC24 2.50X10 (BALLOONS) IMPLANT
BALLOON SAPPHIRE NC24 3.0X8 (BALLOONS) IMPLANT
CATH INFINITI 5 FR 3DRC (CATHETERS) IMPLANT
CATH INFINITI JR4 5F (CATHETERS) IMPLANT
CATH LAUNCHER 6FR EBU3.5 (CATHETERS) IMPLANT
CATH OPTICROSS HD (CATHETERS) IMPLANT
CATH VISTA GUIDE 6FR XB3 (CATHETERS) IMPLANT
DEVICE RAD COMP TR BAND LRG (VASCULAR PRODUCTS) IMPLANT
GLIDESHEATH SLEND A-KIT 6F 22G (SHEATH) IMPLANT
GUIDEWIRE INQWIRE 1.5J.035X260 (WIRE) IMPLANT
INQWIRE 1.5J .035X260CM (WIRE) ×2
KIT ENCORE 26 ADVANTAGE (KITS) IMPLANT
KIT HEART LEFT (KITS) ×1 IMPLANT
KIT MICROPUNCTURE NIT STIFF (SHEATH) IMPLANT
PACK CARDIAC CATHETERIZATION (CUSTOM PROCEDURE TRAY) ×1 IMPLANT
SHEATH PINNACLE 6F 10CM (SHEATH) IMPLANT
SHEATH PROBE COVER 6X72 (BAG) IMPLANT
SLED PULL BACK IVUS (MISCELLANEOUS) IMPLANT
STENT SYNERGY XD 2.50X24 (Permanent Stent) IMPLANT
SYNERGY XD 2.50X24 (Permanent Stent) ×1 IMPLANT
TRANSDUCER W/STOPCOCK (MISCELLANEOUS) ×1 IMPLANT
TUBING CIL FLEX 10 FLL-RA (TUBING) ×1 IMPLANT
VALVE GUARDIAN II ~~LOC~~ HEMO (MISCELLANEOUS) IMPLANT
WIRE ASAHI PROWATER 180CM (WIRE) IMPLANT
WIRE EMERALD 3MM-J .035X150CM (WIRE) IMPLANT

## 2022-01-01 NOTE — ED Triage Notes (Signed)
Patient states that he awoke this am with bilateral arm pain and chest pain with some diaphoresis. Took 2 tylenol with some relief. Alert and oriented and reports pain is improved

## 2022-01-01 NOTE — Progress Notes (Signed)
  Echocardiogram 2D Echocardiogram has been performed.  Nicolas Krause 01/01/2022, 2:04 PM

## 2022-01-01 NOTE — H&P (Signed)
Weston Kallman is an 71 y.o. male.   Chief Complaint: Chest pain HPI:   71 y.o. [place] male  with controlled type 2 DM, CKD (GFR 45), presented with chest pain.  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 EKG in [facility] ER showed anterior STEMI. Currently, he is pain free. HR 100, SBP 154/92 mmHg, 100% on RA.   Past Medical History:  Diagnosis Date   Diabetes mellitus without complication (HCC)    Type 2 since age 16 yrs    History reviewed. No pertinent surgical history.   Family History  Problem Relation Age of Onset   Congestive Heart Failure Mother    Heart disease Father    Heart disease Sister    Heart disease Brother    Diabetes Sister     Social History:  reports that he has never smoked. He has never used smokeless tobacco. He reports that he does not use drugs. No history on file for alcohol use.  Allergies: Not on File  Review of Systems  Cardiovascular:  Positive for chest pain. Negative for dyspnea on exertion, leg swelling, palpitations and syncope.     Blood pressure (!) 154/92, pulse 94, temperature 98 F (36.7 C), resp. rate 17, height 5\' 2"  (1.575 m), weight 59 kg, SpO2 100 %. Body mass index is 23.78 kg/m.   Physical Exam Vitals and nursing note reviewed.  Constitutional:      General: He is not in acute distress. Neck:     Vascular: No JVD.  Cardiovascular:     Rate and Rhythm: Normal rate and regular rhythm.     Heart sounds: Normal heart sounds. No murmur heard. Pulmonary:     Effort: Pulmonary effort is normal.     Breath sounds: Normal breath sounds. No wheezing or rales.  Musculoskeletal:     Right lower leg: No edema.     Left lower leg: No edema.      (Not in a hospital admission)     Current Facility-Administered Medications:    0.9 %  sodium chloride infusion, , Intravenous, Continuous, [place], Madison A, PA-C, Last Rate: 20 mL/hr at 01/01/22 0918, New Bag at 01/01/22  01/03/22  Current Outpatient Medications:    Cholecalciferol (VITAMIN D3) 25 MCG (1000 UT) CAPS, Take 1 capsule by mouth daily., Disp: , Rfl:    empagliflozin (JARDIANCE) 25 MG TABS tablet, Take 25 mg by mouth daily., Disp: , Rfl:    insulin degludec (TRESIBA FLEXTOUCH) 100 UNIT/ML FlexTouch Pen, Inject 70 Units into the skin daily., Disp: , Rfl:    Semaglutide (OZEMPIC, 0.25 OR 0.5 MG/DOSE, [place]), Inject 25 mg into the skin once a week., Disp: , Rfl:    Testosterone 20 % CREA, Apply 100 mg topically daily., Disp: 100 g, Rfl: 0   Today's Vitals   01/01/22 0854 01/01/22 0855 01/01/22 0900  BP:  (!) 154/92   Pulse:  94   Resp:  17   Temp:  98 F (36.7 C)   SpO2:  100%   Weight:   59 kg  Height:   5\' 2"  (1.575 m)  PainSc: 3      Body mass index is 23.78 kg/m.    Lab Results: Reviewed and interpreted: Pending    Tests ordered:  Lab Orders         Resp Panel by RT-PCR (Flu A&B, Covid) Anterior Nasal Swab         CBC  Hemoglobin A1c         Protime-INR         APTT         Comprehensive metabolic panel         Lipid panel         I-stat chem 8, ED         I-stat chem 8, ED (not at [facility] or [facility])       Cardiac Studies:  EKG 01/01/2022: Sinus rhythm Anterolateral STEMI  Echocardiogram: Ordered   Assessment & Recommendations:  71 y.o. [place] male  with controlled type 2 DM, CKD (GFR 45), anterolateral STEMI  STEMI: Currently pain free, hemodynamically stable, slightly tachycardic Recommend urgent coronary angiography and intervention Emergency consent obtained Received ASA 324 mg, heparin 4000 U so far.     Elder Negus, MD Pager: [phone] Office: [phone]

## 2022-01-01 NOTE — ED Provider Triage Note (Signed)
Emergency Medicine Provider Triage Evaluation Note  Nicolas Krause , a 71 y.o. male  was evaluated in triage.  Patient complains of chest pain that started this morning.  Woke up diaphoretic as well.  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  No history of ACS.  History of diabetes.   Review of Systems  Positive: Chest pain, diaphoresis Negative:   Physical Exam  BP (!) 154/92 (BP Location: Right Arm)   Pulse 94   Temp 98 F (36.7 C)   Resp 17   SpO2 100%  Gen:   Awake, no distress   Resp:  Normal effort  MSK:   Moves extremities without difficulty  Other:    Medical Decision Making  Medically screening exam initiated at 9:05 AM.  Appropriate orders placed.  Estanislado Surgeon was informed that the remainder of the evaluation will be completed by another provider, this initial triage assessment does not replace that evaluation, and the importance of remaining in the ED until their evaluation is complete.   Patient EKG showing STEMI.  Confirmed by MD Curatolo.  Code STEMI paged out by this provider.  Ordered aspirin and nitroglycerin, awaiting cardiology   Saddie Benders, PA-C 01/01/22 1610

## 2022-01-01 NOTE — ED Notes (Addendum)
STEMI activated

## 2022-01-01 NOTE — ED Provider Notes (Signed)
[facility] CARDIAC CATH LAB Provider Note   CSN: [id number] Arrival date & time: 01/01/22  0845     History  No chief complaint on file.   Nicolas Krause is a 71 y.o. male.  71 year old male with prior medical history as detailed below presents for evaluation of chest pain.  Patient that he woke with chest pain.  He had diaphoresis.  His pain radiated to his mid back.  He denies prior history of ACS or other cardiac disease.  Patient is an Administrator, arts.  He did contact Dr. Jacinto Halim prior to arrival.  Code STEMI initiated in triage.  Dr. Rosemary Holms is aware of case.    The history is provided by the patient and medical records.       Home Medications Prior to Admission medications   Medication Sig Start Date End Date Taking? Authorizing Provider  Cholecalciferol (VITAMIN D3) 25 MCG (1000 UT) CAPS Take 1 capsule by mouth daily.    [provider]  empagliflozin (JARDIANCE) 25 MG TABS tablet Take 25 mg by mouth daily.    [provider]  insulin degludec (TRESIBA FLEXTOUCH) 100 UNIT/ML FlexTouch Pen Inject 70 Units into the skin daily.    [provider]  Semaglutide (OZEMPIC, 0.25 OR 0.5 MG/DOSE, [place]) Inject 25 mg into the skin once a week.    [provider]  Testosterone 20 % CREA Apply 100 mg topically daily. 06/30/20   Wilson Singer, MD      Allergies    Penicillins    Review of Systems   Review of Systems  All other systems reviewed and are negative.   Physical Exam Updated Vital Signs BP 123/68   Pulse 95   Temp 98 F (36.7 C)   Resp 16   Ht 5\' 2"  (1.575 m)   Wt 52.6 kg   SpO2 100%   BMI 21.22 kg/m  Physical Exam Vitals and nursing note reviewed.  Constitutional:      General: He is not in acute distress.    Appearance: Normal appearance. He is well-developed.  HENT:     Head: Normocephalic and atraumatic.  Eyes:     Conjunctiva/sclera: Conjunctivae normal.     Pupils: Pupils are equal, round, and  reactive to light.  Cardiovascular:     Rate and Rhythm: Normal rate and regular rhythm.     Heart sounds: Normal heart sounds.  Pulmonary:     Effort: Pulmonary effort is normal. No respiratory distress.     Breath sounds: Normal breath sounds.  Abdominal:     General: There is no distension.     Palpations: Abdomen is soft.     Tenderness: There is no abdominal tenderness.  Musculoskeletal:        General: No deformity. Normal range of motion.     Cervical back: Normal range of motion and neck supple.  Skin:    General: Skin is warm and dry.  Neurological:     General: No focal deficit present.     Mental Status: He is alert and oriented to person, place, and time.     ED Results / Procedures / Treatments   Labs (all labs ordered are listed, but only abnormal results are displayed) Labs Reviewed  CBC - Abnormal; Notable for the following components:      Result Value   MCHC 36.3 (*)    All other components within normal limits  COMPREHENSIVE METABOLIC PANEL - Abnormal; Notable for the following components:  Glucose, Bld 258 (*)    BUN 32 (*)    Creatinine, Ser 1.79 (*)    GFR, Estimated 40 (*)    All other components within normal limits  I-STAT CHEM 8, ED - Abnormal; Notable for the following components:   BUN 34 (*)    Creatinine, Ser 1.80 (*)    Glucose, Bld 266 (*)    All other components within normal limits  RESP PANEL BY RT-PCR (FLU A&B, COVID) ARPGX2  HEMOGLOBIN A1C  PROTIME-INR  APTT  LIPID PANEL  I-STAT CHEM 8, ED  TROPONIN I (HIGH SENSITIVITY)    EKG  EKG Interpretation  Date/Time:  Sunday January 01 2022 08:53:14 EDT Ventricular Rate:  102 PR Interval:  146 QRS Duration: 100 QT Interval:  340 QTC Calculation: 443 R Axis:   -26 Text Interpretation: Critical Test Result: STEMI Sinus tachycardia with Premature atrial complexes Incomplete right bundle branch block Anterolateral infarct , possibly acute  ACUTE MI / STEMI Abnormal ECG No previous  ECGs available Confirmed by Kristine Royal [phone]) on 01/01/2022 9:02:10 AM  Radiology No results found.  Procedures Procedures    Medications Ordered in ED Medications  0.9 %  sodium chloride infusion ( Intravenous New Bag/Given 01/01/22 0918)  Heparin (Porcine) in NaCl 1000-0.9 UT/500ML-% SOLN (500 mLs  Given 01/01/22 0950)  lidocaine (PF) (XYLOCAINE) 1 % injection (2 mLs Infiltration Given 01/01/22 0953)  heparin sodium (porcine) injection (2,000 Units Intravenous Given 01/01/22 0955)  midazolam (VERSED) injection (1 mg Intravenous Given 01/01/22 0955)  fentaNYL (SUBLIMAZE) injection (25 mcg Intravenous Given 01/01/22 0955)  aspirin chewable tablet 324 mg (324 mg Oral Given 01/01/22 0914)  heparin injection 4,000 Units (4,000 Units Intravenous Given 01/01/22 5681)    ED Course/ Medical Decision Making/ A&P                           Medical Decision Making Amount and/or Complexity of Data Reviewed Labs: ordered. Radiology: ordered.    Medical Screen Complete  This patient presented to the ED with complaint of chest pain.  This complaint involves an extensive number of treatment options. The initial differential diagnosis includes, but is not limited to, ACS  This presentation is: Acute, Self-Limited, Previously Undiagnosed, Uncertain Prognosis, Complicated, Systemic Symptoms, and Threat to Life/Bodily Function  Patient presents with complaint of chest pain.  EKG is concerning for STEMI.  Code STEMI initiated in triage.  Dr. Rosemary Holms is aware of case and is en route to see the patient.  Patient is comfortable with significant improvement in symptoms while in the ED.   Additional history obtained:  External records from outside sources obtained and reviewed including prior ED visits and prior Inpatient records.    Lab Tests:  I ordered and personally interpreted labs.  The pertinent results include: CBC, CMP, troponin, i-STAT Chem-8, lipid panel   Imaging Studies  ordered:  I ordered imaging studies including chest x-ray I agree with the radiologist interpretation.   Cardiac Monitoring:  The patient was maintained on a cardiac monitor.  I personally viewed and interpreted the cardiac monitor which showed an underlying rhythm of: NSR   Medicines ordered:  I ordered medication including heparin, ASA  for ACS  Reevaluation of the patient after these medicines showed that the patient: improved   Problem List / ED Course:  Chest pain / ACS   Reevaluation:  After the interventions noted above, I reevaluated the patient and found that they have: improved  Disposition:  After consideration of the diagnostic results and the patients response to treatment, I feel that the patent would benefit from admission.   CRITICAL CARE Performed by: Wynetta Fines   Total critical care time: 30 minutes  Critical care time was exclusive of separately billable procedures and treating other patients.  Critical care was necessary to treat or prevent imminent or life-threatening deterioration.  Critical care was time spent personally by me on the following activities: development of treatment plan with patient and/or surrogate as well as nursing, discussions with consultants, evaluation of patient's response to treatment, examination of patient, obtaining history from patient or surrogate, ordering and performing treatments and interventions, ordering and review of laboratory studies, ordering and review of radiographic studies, pulse oximetry and re-evaluation of patient's condition.          Final Clinical Impression(s) / ED Diagnoses Final diagnoses:  ACS (acute coronary syndrome) [facility])    Rx / DC Orders ED Discharge Orders     None         Wynetta Fines, MD 01/01/22 1001

## 2022-01-01 NOTE — Progress Notes (Addendum)
Arterial sheath removed per protocol. Last ACT 155 prior to removal. Pt tolerated removal well, did have bradycardic and hypotensive episode with a heart rate in the 40s, blood pressure 68/45. Pt remained alert and oriented, pressure recovered to 100/65 with HR in 90s.

## 2022-01-02 ENCOUNTER — Telehealth (HOSPITAL_COMMUNITY): Payer: Self-pay | Admitting: Pharmacy Technician

## 2022-01-02 ENCOUNTER — Encounter (HOSPITAL_COMMUNITY): Payer: Self-pay | Admitting: Cardiology

## 2022-01-02 ENCOUNTER — Other Ambulatory Visit: Payer: Self-pay | Admitting: Cardiology

## 2022-01-02 ENCOUNTER — Other Ambulatory Visit (HOSPITAL_COMMUNITY): Payer: Self-pay

## 2022-01-02 LAB — POCT ACTIVATED CLOTTING TIME
Activated Clotting Time: 312 seconds
Activated Clotting Time: 269 seconds
Activated Clotting Time: 209 seconds
Activated Clotting Time: 173 seconds
Activated Clotting Time: 155 seconds

## 2022-01-02 LAB — BASIC METABOLIC PANEL
Anion gap: 9 (ref 5–15)
BUN: 37 mg/dL — ABNORMAL HIGH (ref 8–23)
CO2: 23 mmol/L (ref 22–32)
Calcium: 9.2 mg/dL (ref 8.9–10.3)
Chloride: 108 mmol/L (ref 98–111)
Creatinine, Ser: 2 mg/dL — ABNORMAL HIGH (ref 0.61–1.24)
GFR, Estimated: 35 mL/min — ABNORMAL LOW (ref >60)
Glucose, Bld: 152 mg/dL — ABNORMAL HIGH (ref 70–99)
Potassium: 5.1 mmol/L (ref 3.5–5.1)
Sodium: 140 mmol/L (ref 135–145)

## 2022-01-02 LAB — GLUCOSE, CAPILLARY
Glucose-Capillary: 147 mg/dL — ABNORMAL HIGH (ref 70–99)
Glucose-Capillary: 192 mg/dL — ABNORMAL HIGH (ref 70–99)
Glucose-Capillary: 145 mg/dL — ABNORMAL HIGH (ref 70–99)

## 2022-01-02 LAB — BRAIN NATRIURETIC PEPTIDE: B Natriuretic Peptide: 763.7 pg/mL — ABNORMAL HIGH (ref 0.0–100.0)

## 2022-01-02 MED ORDER — TRESIBA FLEXTOUCH 100 UNIT/ML ~~LOC~~ SOPN
30.0000 [IU] | PEN_INJECTOR | Freq: Every day | SUBCUTANEOUS | 1 refills | Status: AC
  Filled 2022-01-02: qty 9, 30d supply, fill #0
  Filled 2022-01-27: qty 9, 30d supply, fill #1

## 2022-01-02 MED ORDER — ASPIRIN 81 MG PO TBEC
81.0000 mg | DELAYED_RELEASE_TABLET | Freq: Every day | ORAL | 1 refills | Status: DC
  Filled 2022-01-02: qty 30, 30d supply, fill #0

## 2022-01-02 MED ORDER — FUROSEMIDE 10 MG/ML IJ SOLN
20.0000 mg | Freq: Two times a day (BID) | INTRAMUSCULAR | Status: DC
Start: 2022-01-02 — End: 2022-01-02
  Administered 2022-01-02: 20 mg via INTRAVENOUS
  Filled 2022-01-02: qty 2

## 2022-01-02 MED ORDER — FUROSEMIDE 40 MG PO TABS
40.0000 mg | ORAL_TABLET | Freq: Every day | ORAL | 1 refills | Status: DC
  Filled 2022-01-02: qty 30, 30d supply, fill #0

## 2022-01-02 MED ORDER — IVABRADINE HCL 5 MG PO TABS
5.0000 mg | ORAL_TABLET | Freq: Two times a day (BID) | ORAL | Status: DC
  Administered 2022-01-02: 5 mg via ORAL
  Filled 2022-01-02 (×2): qty 1

## 2022-01-02 MED ORDER — ROSUVASTATIN CALCIUM 10 MG PO TABS
10.0000 mg | ORAL_TABLET | Freq: Every day | ORAL | 1 refills | Status: DC
  Filled 2022-01-02: qty 30, 30d supply, fill #0
  Filled 2022-01-27: qty 30, 30d supply, fill #1

## 2022-01-02 MED ORDER — TICAGRELOR 90 MG PO TABS
90.0000 mg | ORAL_TABLET | Freq: Two times a day (BID) | ORAL | 1 refills | Status: DC
  Filled 2022-01-02: qty 60, 30d supply, fill #0
  Filled 2022-01-27: qty 60, 30d supply, fill #1

## 2022-01-02 MED ORDER — NITROGLYCERIN 0.4 MG SL SUBL
0.4000 mg | SUBLINGUAL_TABLET | SUBLINGUAL | 1 refills | Status: AC | PRN
  Filled 2022-01-02: qty 25, 7d supply, fill #0

## 2022-01-02 NOTE — TOC Progression Note (Addendum)
Transition of Care [facility]) - Progression Note    Patient Details  Name: Nicolas Krause MRN: [id number] Date of Birth: 04/24/1951  Transition of Care [facility]) CM/SW Contact  Beckie Busing, RN Phone Number:[phone]  01/02/2022, 12:12 PM  Clinical Narrative:    TOC acknowledges orders for life vest. Paperwork including new order has been faxed to [employer]. CM has attempted to contact [employer] ([employer] rep) to make her aware of new order for life vest. No answer voicemail has been left. Will await return call.  [id number] Message has been sent to [facility] to follow up on life vest. Rayfield Citizen acknowledges that information has been received and under review.   1500 CM has received several messages to inquire about life vest. CM has just been made aware that life vest has been approved and patient can be fitted for life vest this evening. MD Truett Mainland has inquired to know if patient can be fitted for life vest at home. 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 CM has made MD aware and MD is ok with this option. Caroline with [employer] has been updated on the plan to be fitted at home.   [id number] Reminder has been added to avs for patient to answer any unknown number because it may be the [employer] rep attempting to get in touch with him. Nurses has been made aware to remind the patient.       Expected Discharge Plan and Services                                                 Social Determinants of Health (SDOH) Interventions    Readmission Risk Interventions     No data to display

## 2022-01-02 NOTE — Progress Notes (Signed)
CARDIAC REHAB PHASE I   PRE:  Rate/Rhythm: 96 SR   BP:  Sitting: 107/63   SaO2: 98  MODE:  Ambulation: 370 ft   POST:  Rate/Rhythm: 98 SR  BP:  Sitting: 135/77   SaO2: 100  Pt to standing from recliner independently and agreeable to ambulation. Pt ambulated in hallway independently with no s/s. Pt educated on site care, restrictions, risk factors, exercise guidelines, angina/NTG, stent, MI booklet, nutrition, and orientation to CRP2 referral to [facility]. All questions from pt answered and pt left in recliner with call bell in reach.  [phone]  Nicolas Coup, MS 01/02/2022 8:59 AM

## 2022-01-02 NOTE — Telephone Encounter (Signed)
Pharmacy Patient Advocate Encounter  Insurance verification completed.    The patient is insured through [employer] Part D   The patient is currently admitted and ran test claims for the following: Brilinta.  Copays and coinsurance results were relayed to Inpatient clinical team.

## 2022-01-02 NOTE — TOC Benefit Eligibility Note (Signed)
Patient Product/process development scientist completed.    The patient is currently admitted and upon discharge could be taking Brilnta 90 mg.  The current 30 day co-pay is $494.11 due to a $505.00 deductible.   The patient is insured through [employer] Part D   Roland Earl, CPhT Pharmacy Patient Advocate Specialist [facility] Patient Advocate Team Direct Number: [phone]  Fax: [phone]

## 2022-01-02 NOTE — Discharge Summary (Signed)
Physician Discharge Summary  Patient ID: Nicolas Krause MRN: [id number] DOB/AGE: 73-Dec-1952 73 y.o.  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  Culprit lesion in mid LAD was successfully treated with primary PCI, details below.  Patient has diffuse and at least moderate residual disease in ostial to proximal LAD.  Interventional management was deferred to avoid contrast-induced nephropathy in the setting of underlying CKD.  Echocardiogram showed severe LAD territory hypokinesis with EF of 20-25%.  BNP was elevated at 700.  However, patient was clinically very well compensated and ambulated without any chest pain or shortness of breath.  Further inpatient management was discussed, but patient was very keen on discharge home, with close follow-up.  Given that he is fairly asymptomatic, and is very reliable for follow-up, this is reasonable.  I resumed his Edarbi at 40 mg on discharge, he was on losartan 12.5 mg daily during the hospitalization.  I added p.o. 40 mg Lasix daily on discharge, and resume home Jardiance.  Given low normal blood pressure, and low EF, I held beta-blocker, and also held spironolactone initiation given increasing creatinine and potassium.  I will resume this during close outpatient follow-up, scheduled on 01/05/2022.  Patient will obtain BMP on 01/04/2022.  He was discharged on dual antiplatelet therapy aspirin, Brilinta, Crestor 10 mg daily.  He is also participating in clinical trial eval-MI for early initiation of Repatha.  Lipoprotein a is pending. Given relatively low LDL, suspicion for elevated lipoprotein (a), I chose to use Crestor 10, rather than 20 mg daily. I will add  beta blocker outpatient after further diuresis.  Finally, LifeVest was recommended given anterior MI and EF 20-25%.  Once approved, this will be delivered to his home, hopefully later today.  Did the patient have an acute coronary syndrome (MI, NSTEMI, STEMI, etc) this admission?: Yes                               AHA/ACC Clinical Performance & Quality Measures: Aspirin prescribed? - Yes ADP Receptor Inhibitor (Plavix/Clopidogrel, Brilinta/Ticagrelor or Effient/Prasugrel) prescribed (includes medically managed patients)? - Yes Beta Blocker prescribed? - No - low normal blood pressure, low EF, anterior MI.  This will be started outpatient on 01/05/2022. High Intensity Statin (Lipitor 40-66m or Crestor 20-486m prescribed? - Yes EF assessed during THIS hospitalization? - Yes For EF <40%, was ACEI/ARB prescribed? - Yes For EF <40%, Aldosterone Antagonist (Spironolactone or Eplerenone) prescribed? - No - Reason:  Creatinine 2.0, potassium 5.1.  This will be started outpatient. Cardiac Rehab Phase II ordered (including medically managed patients)? - Yes   Discharge Exam: Blood pressure 117/65, pulse 92, temperature 98 F (36.7 C), temperature source Oral, resp. rate 18, height '5\' 2"'  (1.575 m), weight 53.1 kg, SpO2 99 %.   Physical Exam Vitals and nursing note reviewed.  Constitutional:      General: He is not in acute distress. Neck:     Vascular: No JVD.  Cardiovascular:     Rate and Rhythm: Normal rate and regular rhythm.     Heart sounds: Normal heart sounds. No murmur heard. Pulmonary:     Effort: Pulmonary effort is normal.     Breath sounds: Normal  breath sounds. No wheezing or rales.  Musculoskeletal:     Right lower leg: No edema.     Left lower leg: No edema.      Significant Diagnostic Studies:  EKG 01/02/2022: Sinus rhythm, recent anterolateral infarct  Echocardiogram 01/01/2022:  1. No LV thrombus. Left ventricular ejection fraction, by estimation, is  20 to 25%.  The left ventricle has severely decreased function. The left  ventricle demonstrates regional wall motion abnormalities (see scoring  diagram/findings for description).  Indeterminate diastolic filling due to E-A fusion. There is akinesis of  the left ventricular, mid-apical anterior wall, anteroseptal wall and  apical segment. There is severe hypokinesis of the left ventricular,  apical inferolateral wall.   2. Right ventricular systolic function is mildly reduced. The right  ventricular size is normal. There is normal pulmonary artery systolic  pressure.   3. The mitral valve is normal in structure. Mild mitral valve  regurgitation. No evidence of mitral stenosis.   4. The aortic valve is normal in structure. Aortic valve regurgitation is  not visualized. No aortic stenosis is present.   Coronary intervention 01/01/2022: LM: Diffuse 40% disease, MLA 7.3 mm2 LAD: Diffuse prox 50% disease, MLA 4.0 mm2          Mid 95% stenosis (Culprit lesion), followed by 40% disease Lcx: Large, dominant        Prox 20%, OM2 ostial 20%, OM# ostial 30% disease RCA: Small, non-dominant. No significant disease   LVEDP 24 mmHg   Successful percutaneous coronary intervention mid LAD     IVUS guided PTCA and stent placement Synergy 2.5 X 24 mm Synergy drug-eluting stent     0% culprit stenosis     TIMI flow I-->III Will consider re-look angiography or stress testing in a few days to address prox LAD disease.  FOLLOW UP PLANS AND APPOINTMENTS Discharge Instructions     Amb Referral to Cardiac Rehabilitation   Complete by: As directed    Diagnosis:  Coronary Stents STEMI     After initial evaluation and assessments completed: Virtual Based Care may be provided alone or in conjunction with Phase 2 Cardiac Rehab based on patient barriers.: Yes   Intensive Cardiac Rehabilitation ([place]) [place] location only OR Traditional Cardiac Rehabilitation (TCR) . If criteria for ICR are not met will enroll in TCR [facility]  only): Yes   Diet - low sodium heart healthy   Complete by: As directed    Increase activity slowly   Complete by: As directed       Allergies as of 01/02/2022       Reactions   Penicillins Rash        Medication List     TAKE these medications    ascorbic acid 500 MG tablet Commonly known as: VITAMIN C Take 500 mg by mouth daily.   Aspirin Low Dose 81 MG tablet Generic drug: aspirin EC Take 1 tablet (81 mg total) by mouth daily. Swallow whole. Start taking on: January 03, 2022   Brilinta 90 MG Tabs tablet Generic drug: ticagrelor Take 1 tablet (90 mg total) by mouth 2 (two) times daily.   Dexilant 60 MG capsule Generic drug: dexlansoprazole Take 60 mg by mouth daily.   Edarbi 80 MG Tabs Generic drug: Azilsartan Medoxomil Take 80 mg by mouth daily.   furosemide 40 MG tablet Commonly known as: Lasix Take 1 tablet (40 mg total) by mouth daily.   Jardiance 25 MG Tabs tablet Generic drug: empagliflozin Take 25 mg by  mouth daily.   nitroGLYCERIN 0.4 MG SL tablet Commonly known as: NITROSTAT Place 1 tablet (0.4 mg total) under the tongue every 5 (five) minutes x 3 doses as needed for chest pain.   OZEMPIC (0.25 OR 0.5 MG/DOSE)  Inject 0.5 mg into the skin every Sunday.   rosuvastatin 10 MG tablet Commonly known as: CRESTOR Take 1 tablet (10 mg total) by mouth daily. Start taking on: January 03, 2022   Testosterone 20 % Crea Apply 100 mg topically daily.   Tyler Aas FlexTouch 100 UNIT/ML FlexTouch Pen Generic drug: insulin degludec Inject 30 Units into the skin at bedtime.   Vitamin D3 25 MCG (1000 UT) Caps Take 1,000 Units by mouth daily.               Durable Medical Equipment  (From admission, onward)           Start     Ordered   01/02/22 0912  For home use only DME Vest life vest  Once       Comments: Duration: 3 months   01/02/22 0915            Follow-up Information     [employer] Life Vest Follow up.   Why: Please be advised  that a [employer] life vest rep may call you from an unknown number. Please answer because it is the rep attempting to get in touch with you to fit you for your life vest.        Nigel Mormon, MD Follow up on 01/05/2022.   Specialties: Cardiology, Radiology Why: 3:30 PM Contact information: [place] [address] [phone]                   Nigel Mormon, MD Pager: [phone] Office: [phone]

## 2022-01-03 ENCOUNTER — Telehealth: Payer: Self-pay

## 2022-01-03 LAB — LIPOPROTEIN A (LPA): Lipoprotein (a): 87.5 nmol/L — ABNORMAL HIGH (ref <75.0)

## 2022-01-03 NOTE — Patient Outreach (Signed)
  Care Coordination TOC Note Transition Care Management Follow-up Telephone Call Date of discharge and from where: [facility] 01/02/22 How have you been since you were released from the hospital? Patient notes he was delivered his LifeVest last evening and he is wearing it.  He was given the phone number to find a new PCP- [place] [facility]-[phone]  Any questions or concerns? No  Items Reviewed: Did the pt receive and understand the discharge instructions provided? Yes  Medications obtained and verified? Yes  Other? No  Any new allergies since your discharge? No  Dietary orders reviewed? Yes Do you have support at home? Yes   Home Care and Equipment/Supplies: Were home health services ordered? no If so, what is the name of the agency? N/A  Has the agency set up a time to come to the patient's home? no Were any new equipment or medical supplies ordered?  No What is the name of the medical supply agency? N/A Were you able to get the supplies/equipment? not applicable Do you have any questions related to the use of the equipment or supplies? No  Functional Questionnaire: (I = Independent and D = Dependent) ADLs: I  Bathing/Dressing- I  Meal Prep- I  Eating- I  Maintaining continence- I  Transferring/Ambulation- I  Managing Meds- I  Follow up appointments reviewed:  PCP Hospital f/u appt confirmed? No  Patient notes he will call [place] [facility] to get new PCP. Specialist Hospital f/u appt confirmed? Yes  Scheduled to see Dr. Arnell Sieving on 01/05/22 @ 3:30. Are transportation arrangements needed? No  If their condition worsens, is the pt aware to call PCP or go to the Emergency Dept.? Yes Was the patient provided with contact information for the PCP's office or ED? Yes Was to pt encouraged to call back with questions or concerns? Yes  SDOH assessments and interventions completed:   Yes  Care Coordination Interventions Activated:  Yes   Care  Coordination Interventions:  PCP follow up appointment requested    Encounter Outcome:  Pt. Visit Completed

## 2022-01-03 NOTE — Telephone Encounter (Signed)
Location of hospitalization: [place] Reason for hospitalization: cp Date of discharge: 01/02/2022 Date of first communication with patient: today Person contacting patient: Me Current symptoms: NA Do you understand why you were in the Hospital: Yes Questions regarding discharge instructions: None Where were you discharged to: Home Medications reviewed: Yes Allergies reviewed: Yes Dietary changes reviewed: Yes. Discussed low fat and low salt diet.  Referals reviewed: NA Activities of Daily Living: Able to with mild limitations Any transportation issues/concerns: None Any patient concerns: None Confirmed importance & date/time of Follow up appt: Yes Confirmed with patient if condition begins to worsen call. Pt was given the office number and encouraged to call back with questions or concerns: Yes

## 2022-01-05 ENCOUNTER — Ambulatory Visit: Payer: Medicare Other | Admitting: Cardiology

## 2022-01-05 ENCOUNTER — Encounter: Payer: Self-pay | Admitting: Cardiology

## 2022-01-05 ENCOUNTER — Other Ambulatory Visit: Payer: Self-pay | Admitting: Cardiology

## 2022-01-05 VITALS — BP 149/70 | HR 95 | Temp 98.0°F | Resp 16 | Ht 62.0 in | Wt 123.0 lb

## 2022-01-05 DIAGNOSIS — I252 Old myocardial infarction: Secondary | ICD-10-CM

## 2022-01-05 DIAGNOSIS — I5020 Unspecified systolic (congestive) heart failure: Secondary | ICD-10-CM | POA: Diagnosis not present

## 2022-01-05 DIAGNOSIS — I2510 Atherosclerotic heart disease of native coronary artery without angina pectoris: Secondary | ICD-10-CM | POA: Diagnosis not present

## 2022-01-05 DIAGNOSIS — N179 Acute kidney failure, unspecified: Secondary | ICD-10-CM | POA: Insufficient documentation

## 2022-01-05 MED ORDER — REPATHA SURECLICK 140 MG/ML ~~LOC~~ SOAJ: 140.0000 mg | SUBCUTANEOUS | 5 refills | Status: DC

## 2022-01-05 MED ORDER — METOPROLOL SUCCINATE ER 25 MG PO TB24: 12.5000 mg | ORAL_TABLET | Freq: Every day | ORAL | 1 refills | Status: DC

## 2022-01-05 NOTE — Progress Notes (Signed)
Follow up visit  Subjective:   Nicolas Krause, male    DOB: 03/14/1951, 71 y.o.   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  Patient has diffuse and at least moderate residual disease in ostial to proximal LAD.  Interventional management was deferred to avoid contrast-induced nephropathy in the setting of underlying CKD.  Echocardiogram showed severe LAD territory hypokinesis with EF of 20-25%.  BNP was elevated at 700.  However, patient was clinically very well compensated and ambulated without any chest pain or shortness of breath.  Further inpatient management was discussed, but patient was very keen on discharge home, with close follow-up.  Given that he is fairly asymptomatic, and is very reliable for follow-up, this is reasonable.  I resumed his ARB on discharge, hydrocele losartan 12.5 mg daily during the hospitalization.  I added p.o. 40 mg Lasix daily on discharge, and resume home Jardiance.  Given low normal blood pressure, and low EF, I held beta-blocker, and also held spironolactone initiation given increasing creatinine and potassium.  I will resume this during close outpatient follow-up, scheduled on 01/05/2022.  Patient will obtain BMP on 01/04/2022.  He was discharged on dual antiplatelet therapy aspirin, Brilinta, Crestor 10 mg daily.  He is also participating in clinical trial eval-MI for early initiation of Repatha.  Lipoprotein a is pending. Given relatively low LDL, suspicion for elevated lipoprotein (a), I chose to use Crestor 10, rather than 20 mg daily. I will add beta blocker  outpatient after further diuresis. Finally, LifeVest was recommended given anterior MI and EF 20-25%.  Once approved, this will be delivered to his home, hopefully later today.  Patient is here with his son Nicolas Krause today. He has not had any chest pain, shortness of breath since discharge. Today, he was able to climb a flight of 20 stairs without any difficulty or symptoms. Reviewed BMP labs from 8/30 with the patient, details below.     Current Outpatient Medications:    ascorbic acid (VITAMIN C) 500 MG tablet, Take 500 mg by mouth daily., Disp: , Rfl:    aspirin EC 81 MG tablet, Take 1 tablet (81 mg total) by mouth daily. Swallow whole., Disp: 30 tablet, Rfl: 1   Cholecalciferol (VITAMIN D3) 25 MCG (1000 UT) CAPS, Take 1,000 Units by mouth daily., Disp: , Rfl:    dexlansoprazole (DEXILANT) 60 MG capsule, Take 60 mg by mouth daily., Disp: , Rfl:    empagliflozin (JARDIANCE) 25 MG TABS tablet, Take 25 mg by mouth daily., Disp: , Rfl:    furosemide (LASIX) 40 MG tablet, Take 1 tablet (40 mg total) by mouth daily., Disp: 30 tablet, Rfl: 1   insulin degludec (TRESIBA FLEXTOUCH) 100 UNIT/ML FlexTouch Pen, Inject 30 Units into the skin at bedtime., Disp: 9 mL, Rfl: 1   nitroGLYCERIN (NITROSTAT) 0.4 MG SL tablet, Place 1 tablet (0.4 mg total) under the tongue every 5 (five) minutes x 3 doses as needed for chest pain., Disp: 25 tablet, Rfl: 1   rosuvastatin (CRESTOR) 10 MG tablet, Take 1 tablet (10 mg total) by mouth daily., Disp: 30 tablet, Rfl:  1   Semaglutide (OZEMPIC, 0.25 OR 0.5 MG/DOSE, [place]), Inject 0.5 mg into the skin every Sunday., Disp: , Rfl:    ticagrelor (BRILINTA) 90 MG TABS tablet, Take 1 tablet (90 mg total) by mouth 2 (two) times daily., Disp: 60 tablet, Rfl: 1   Cardiovascular & other pertient studies:  Reviewed external labs and tests, independently interpreted  EKG 01/05/2022: Sinus rhythm 97 bpm LAFB Anterior infarct, age indeterminate Low voltage in precordial leads Poor R-wave  progression  Echocardiogram 01/01/2022:  1. No LV thrombus. Left ventricular ejection fraction, by estimation, is  20 to 25%. The left ventricle has severely decreased function. The left  ventricle demonstrates regional wall motion abnormalities (see scoring  diagram/findings for description).  Indeterminate diastolic filling due to E-A fusion. There is akinesis of  the left ventricular, mid-apical anterior wall, anteroseptal wall and  apical segment. There is severe hypokinesis of the left ventricular,  apical inferolateral wall.   2. Right ventricular systolic function is mildly reduced. The right  ventricular size is normal. There is normal pulmonary artery systolic  pressure.   3. The mitral valve is normal in structure. Mild mitral valve  regurgitation. No evidence of mitral stenosis.   4. The aortic valve is normal in structure. Aortic valve regurgitation is  not visualized. No aortic stenosis is present.    Coronary intervention 01/01/2022: LM: Diffuse 40% disease, MLA 7.3 mm2 LAD: Diffuse prox 50% disease, MLA 4.0 mm2          Mid 95% stenosis (Culprit lesion), followed by 40% disease Lcx: Large, dominant        Prox 20%, OM2 ostial 20%, OM# ostial 30% disease RCA: Small, non-dominant. No significant disease   LVEDP 24 mmHg   Successful percutaneous coronary intervention mid LAD     IVUS guided PTCA and stent placement Synergy 2.5 X 24 mm Synergy drug-eluting stent     0% culprit stenosis     TIMI flow I-->III Will consider re-look angiography or stress testing in a few days to address prox LAD disease.  Recent labs: 01/04/2022: Glucose 265, BUN/Cr 67/2.67. EGFR 24. Na/K 133/4.9. Rest of the CMP normal H/H 13/37. MCV 90. Platelets 224  01/02/2022: Glucose 152, BUN/Cr 37/2.0. EGFR 35. Na/K 140/5.1. Rest of the CMP normal H/H 15/41. MCV 87. Platelets 230 HbA1C 5.0% Chol 136, TG 166, HDL 45, LDL 58 Lipoprotein (a) 87 BNP 763 Trop HS 11,490    Review of Systems   Cardiovascular:  Negative for chest pain, dyspnea on exertion, leg swelling, palpitations and syncope.         Vitals:   01/05/22 1511  BP: (!) 149/70  Pulse: 95  Resp: 16  Temp: 98 F (36.7 C)  SpO2: 99%    Body mass index is 22.5 kg/m. Filed Weights   01/05/22 1511  Weight: 123 lb (55.8 kg)    Objective:   Physical Exam Vitals and nursing note reviewed.  Constitutional:      General: He is not in acute distress. Neck:     Vascular: No JVD.  Cardiovascular:     Rate and Rhythm: Normal rate and regular rhythm.     Heart sounds: Normal heart sounds. No murmur heard. Pulmonary:     Effort: Pulmonary effort is normal.     Breath sounds: Normal breath sounds. No wheezing or rales.  Musculoskeletal:     Right lower leg: No edema.     Left lower leg: No edema.  Visit diagnoses:   ICD-10-CM   1. Coronary artery disease involving native coronary artery of native heart without angina pectoris  I25.10 EKG 12-Lead    2. History of acute myocardial infarction  I25.2     3. HFrEF (heart failure with reduced ejection fraction) (HCC)  I50.20     4. AKI (acute kidney injury) ([facility])  N17.9        Orders Placed This Encounter  Procedures   EKG 12-Lead     Medication changes this visit: Medications Discontinued During This Encounter  Medication Reason   EDARBI 80 MG TABS    Testosterone 20 % CREA     Meds ordered this encounter  Medications   metoprolol succinate (TOPROL-XL) 25 MG 24 hr tablet    Sig: Take 0.5 tablets (12.5 mg total) by mouth daily. Take with or immediately following a meal.    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 Unable to use most GDMT  due to renal dysfunction. Discontinue Edarbi and lasix. Add metoprolol succinate 12.5 mg daily. Continue Jardiance-on 25 mg daily, as long as GFR>20. Continue Lifevest  CAD: STEMI 8/203, treated with mid LAD PCI. Residual diffuse at least moderate disease in prox LAD. Re-look angiography on hold due to AKI Continue DAPT with Aspirin and Brilinta at least till 12/2022. LDL 58, lipoprotein (a) 87. Keep statin at low dose Crestor 10 mg daily.  He has had STEMI in spite of LDL 58, A1C 5.0%. Only other reversible factor I can see is lipoprotein. Therefore, I recommend adding Repatha, which can have relative Lp(a) reduction.  AKI: Cr 2.6 Hold lasix, Edarbi, Encourage liberal fluid intake. Repeat BMP 9/5.  F?u on 9/6    Nigel Mormon, MD Pager: [phone] Office: [phone]

## 2022-01-05 NOTE — Progress Notes (Signed)
Your patient 

## 2022-01-06 ENCOUNTER — Other Ambulatory Visit: Payer: Self-pay | Admitting: Cardiology

## 2022-01-06 DIAGNOSIS — I2510 Atherosclerotic heart disease of native coronary artery without angina pectoris: Secondary | ICD-10-CM

## 2022-01-06 DIAGNOSIS — I252 Old myocardial infarction: Secondary | ICD-10-CM

## 2022-01-11 ENCOUNTER — Encounter: Payer: Self-pay | Admitting: Cardiology

## 2022-01-11 ENCOUNTER — Ambulatory Visit: Payer: Self-pay | Admitting: Cardiology

## 2022-01-11 VITALS — BP 141/68 | HR 77 | Resp 16 | Ht 62.0 in | Wt 125.0 lb

## 2022-01-11 DIAGNOSIS — E7841 Elevated Lipoprotein(a): Secondary | ICD-10-CM

## 2022-01-11 DIAGNOSIS — I2510 Atherosclerotic heart disease of native coronary artery without angina pectoris: Secondary | ICD-10-CM | POA: Insufficient documentation

## 2022-01-11 DIAGNOSIS — I5020 Unspecified systolic (congestive) heart failure: Secondary | ICD-10-CM

## 2022-01-11 DIAGNOSIS — I252 Old myocardial infarction: Secondary | ICD-10-CM

## 2022-01-11 MED ORDER — EDARBI 80 MG PO TABS: 40.0000 mg | ORAL_TABLET | Freq: Every day | ORAL | 0 refills | Status: DC

## 2022-01-11 MED ORDER — METOPROLOL SUCCINATE ER 25 MG PO TB24
25.0000 mg | ORAL_TABLET | Freq: Every day | ORAL | 3 refills | Status: DC
Start: 2022-01-11 — End: 2022-01-24

## 2022-01-11 NOTE — Progress Notes (Signed)
Follow up visit  Subjective:   Nicolas Krause, male    DOB: [birth year], 71 y.o.   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 He is going up and down the 15 stairs and working around the yard with no chest pain, dyspnea, leg edema. Reviewed recent test results with the patient, details below. He continues ot wear LifeVest. He is looking forward to his son's wedding later this month.   Discharge summary 01/02/2022: Culprit lesion in mid LAD was successfully treated with primary PCI, details below.  Patient has diffuse and at least moderate residual disease in ostial to proximal LAD.  Interventional management was deferred to avoid contrast-induced nephropathy in the setting of underlying CKD.  Echocardiogram showed severe LAD territory hypokinesis with EF of 20-25%.  BNP was elevated at 700.  However, patient was clinically very well compensated and ambulated without any chest pain or shortness of breath.  Further inpatient management was discussed, but patient was very keen on discharge home, with close follow-up.  Given that he is fairly asymptomatic, and is very reliable for follow-up, this is reasonable.  I resumed his ARB on discharge, hydrocele losartan 12.5 mg daily during the hospitalization.  I added p.o. 40 mg Lasix daily on discharge, and resume home Jardiance.  Given low normal blood pressure, and low EF, I held beta-blocker, and also held spironolactone initiation given increasing creatinine and potassium.  I will resume this during close outpatient follow-up, scheduled on 01/05/2022.  Patient will obtain BMP on 01/04/2022.  He was discharged on dual antiplatelet therapy aspirin, Brilinta, Crestor 10  mg daily.  He is also participating in clinical trial eval-MI for early initiation of Repatha.  Lipoprotein a is pending. Given relatively low LDL, suspicion for elevated lipoprotein (a), I chose to use Crestor 10, rather than 20 mg daily. I will add beta blocker outpatient after further diuresis. Finally, LifeVest was recommended given anterior MI and EF 20-25%.  Once approved, this will be delivered to his home, hopefully later today.  Patient is here with his son Nicolas Krause today. He has not had any chest pain, shortness of breath since discharge. Today, he was able to climb a flight of 20 stairs without any difficulty or symptoms. Reviewed BMP labs from 8/30 with the patient, details below.     Current Outpatient Medications:    ascorbic acid (VITAMIN C) 500 MG tablet, Take 500 mg by mouth daily., Disp: , Rfl:    aspirin EC 81 MG tablet, Take 1 tablet (81 mg total) by mouth daily. Swallow whole., Disp: 30 tablet, Rfl: 1   Cholecalciferol (VITAMIN D3) 25 MCG (1000 UT) CAPS, Take 1,000 Units by mouth daily., Disp: , Rfl:    dexlansoprazole (DEXILANT) 60 MG capsule, Take 60 mg by mouth daily., Disp: , Rfl:    empagliflozin (JARDIANCE) 25 MG TABS tablet, Take 25 mg by mouth daily., Disp: , Rfl:    Evolocumab (REPATHA SURECLICK) 706 MG/ML SOAJ, Inject 140 mg into the skin every 14 (fourteen) days., Disp: 6 mL, Rfl: 5   furosemide (LASIX) 40 MG tablet, Take 1 tablet (40 mg total) by mouth daily., Disp: 30 tablet, Rfl: 1   insulin degludec (TRESIBA FLEXTOUCH)  100 UNIT/ML FlexTouch Pen, Inject 30 Units into the skin at bedtime., Disp: 9 mL, Rfl: 1   metoprolol succinate (TOPROL-XL) 25 MG 24 hr tablet, Take 0.5 tablets (12.5 mg total) by mouth daily. Take with or immediately following a meal., Disp: 30 tablet, Rfl: 1   nitroGLYCERIN (NITROSTAT) 0.4 MG SL tablet, Place 1 tablet (0.4 mg total) under the tongue every 5 (five) minutes x 3 doses as needed for chest pain., Disp: 25 tablet, Rfl: 1   rosuvastatin  (CRESTOR) 10 MG tablet, Take 1 tablet (10 mg total) by mouth daily., Disp: 30 tablet, Rfl: 1   Semaglutide (OZEMPIC, 0.25 OR 0.5 MG/DOSE, [place]), Inject 0.5 mg into the skin every Sunday., Disp: , Rfl:    ticagrelor (BRILINTA) 90 MG TABS tablet, Take 1 tablet (90 mg total) by mouth 2 (two) times daily., Disp: 60 tablet, Rfl: 1   Cardiovascular & other pertient studies:  Reviewed external labs and tests, independently interpreted  EKG 01/05/2022: Sinus rhythm 97 bpm LAFB Anterior infarct, age indeterminate Low voltage in precordial leads Poor R-wave progression  Echocardiogram 01/01/2022:  1. No LV thrombus. Left ventricular ejection fraction, by estimation, is  20 to 25%. The left ventricle has severely decreased function. The left  ventricle demonstrates regional wall motion abnormalities (see scoring  diagram/findings for description).  Indeterminate diastolic filling due to E-A fusion. There is akinesis of  the left ventricular, mid-apical anterior wall, anteroseptal wall and  apical segment. There is severe hypokinesis of the left ventricular,  apical inferolateral wall.   2. Right ventricular systolic function is mildly reduced. The right  ventricular size is normal. There is normal pulmonary artery systolic  pressure.   3. The mitral valve is normal in structure. Mild mitral valve  regurgitation. No evidence of mitral stenosis.   4. The aortic valve is normal in structure. Aortic valve regurgitation is  not visualized. No aortic stenosis is present.    Coronary intervention 01/01/2022: LM: Diffuse 40% disease, MLA 7.3 mm2 LAD: Diffuse prox 50% disease, MLA 4.0 mm2          Mid 95% stenosis (Culprit lesion), followed by 40% disease Lcx: Large, dominant        Prox 20%, OM2 ostial 20%, OM# ostial 30% disease RCA: Small, non-dominant. No significant disease   LVEDP 24 mmHg   Successful percutaneous coronary intervention mid LAD     IVUS guided PTCA and stent placement Synergy  2.5 X 24 mm Synergy drug-eluting stent     0% culprit stenosis     TIMI flow I-->III Will consider re-look angiography or stress testing in a few days to address prox LAD disease.  Recent labs: 01/10/2022: Glucose 154, BUN/Cr 28/1.6. EGFR 45. Na/K 137/4.8. Rest of the CMP normal  01/04/2022: Glucose 265, BUN/Cr 67/2.67. EGFR 24. Na/K 133/4.9. Rest of the CMP normal H/H 13/37. MCV 90. Platelets 224  01/02/2022: Glucose 152, BUN/Cr 37/2.0. EGFR 35. Na/K 140/5.1. Rest of the CMP normal H/H 15/41. MCV 87. Platelets 230 HbA1C 5.0% Chol 136, TG 166, HDL 45, LDL 58 Lipoprotein (a) 87 BNP 763 Trop HS 11,490    Review of Systems  Cardiovascular:  Negative for chest pain, dyspnea on exertion, leg swelling, palpitations and syncope.         Vitals:   01/11/22 1511  BP: (!) 141/68  Pulse: 77  Resp: 16  SpO2: 100%    Body mass index is 22.86 kg/m. Filed Weights   01/11/22 1511  Weight: 125 lb (56.7  kg)    Objective:   Physical Exam Vitals and nursing note reviewed.  Constitutional:      General: He is not in acute distress. Neck:     Vascular: No JVD.  Cardiovascular:     Rate and Rhythm: Normal rate and regular rhythm.     Heart sounds: Normal heart sounds. No murmur heard. Pulmonary:     Effort: Pulmonary effort is normal.     Breath sounds: Normal breath sounds. No wheezing or rales.  Musculoskeletal:     Right lower leg: No edema.     Left lower leg: No edema.          Visit diagnoses:   ICD-10-CM   1. Coronary artery disease involving native coronary artery of native heart without angina pectoris  I25.10     2. HFrEF (heart failure with reduced ejection fraction) (HCC)  I50.20     3. History of acute myocardial infarction  I25.2     4. 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 Unable to use most GDMT due to renal dysfunction. Given improvement in Cr to 1.6, resume Edarbi at 40 mg daily. With Cr 1.6, K 4.8, and recent AKI, I am still abiding ARNI/MRA. Increase metoprolol succinate to 25 mg daily. Continue Jardiance-on 25 mg daily, as long as GFR>20. Continue Lifevest  CAD: STEMI 8/203, treated with mid LAD PCI. Residual diffuse at least moderate disease in prox LAD. Re-look angiography on hold due to AKI Continue DAPT with Aspirin and Brilinta at least till 12/2022. LDL 58, lipoprotein (a) 87. Keep statin at low dose Crestor 10 mg daily.  He has had STEMI in spite of LDL 58, A1C 5.0%. Only other reversible factor I can see is lipoprotein. Therefore, I recommend adding Repatha, which can have relative Lp(a) reduction.  AKI: Resolved. Cr 1.6 (9.5.2023)  F/u in 4 weeks    Nigel Mormon, MD Pager: [phone] Office: [phone]

## 2022-01-24 ENCOUNTER — Other Ambulatory Visit: Payer: Self-pay

## 2022-01-24 MED ORDER — METOPROLOL SUCCINATE ER 50 MG PO TB24: 50.0000 mg | ORAL_TABLET | Freq: Every day | ORAL | 0 refills | Status: DC

## 2022-01-25 DIAGNOSIS — Z23 Encounter for immunization: Secondary | ICD-10-CM | POA: Diagnosis not present

## 2022-01-30 ENCOUNTER — Other Ambulatory Visit (HOSPITAL_BASED_OUTPATIENT_CLINIC_OR_DEPARTMENT_OTHER): Payer: Self-pay

## 2022-02-07 DIAGNOSIS — H43813 Vitreous degeneration, bilateral: Secondary | ICD-10-CM | POA: Diagnosis not present

## 2022-02-07 DIAGNOSIS — E113293 Type 2 diabetes mellitus with mild nonproliferative diabetic retinopathy without macular edema, bilateral: Secondary | ICD-10-CM | POA: Diagnosis not present

## 2022-02-07 DIAGNOSIS — H25813 Combined forms of age-related cataract, bilateral: Secondary | ICD-10-CM | POA: Diagnosis not present

## 2022-02-15 ENCOUNTER — Ambulatory Visit: Payer: Medicare Other | Admitting: Cardiology

## 2022-02-15 ENCOUNTER — Encounter: Payer: Self-pay | Admitting: Cardiology

## 2022-02-15 VITALS — BP 150/75 | HR 70 | Resp 16 | Ht 62.0 in | Wt 122.0 lb

## 2022-02-15 DIAGNOSIS — I249 Acute ischemic heart disease, unspecified: Secondary | ICD-10-CM | POA: Diagnosis not present

## 2022-02-15 DIAGNOSIS — I5020 Unspecified systolic (congestive) heart failure: Secondary | ICD-10-CM

## 2022-02-15 DIAGNOSIS — I2510 Atherosclerotic heart disease of native coronary artery without angina pectoris: Secondary | ICD-10-CM | POA: Diagnosis not present

## 2022-02-15 NOTE — Progress Notes (Signed)
Follow up visit  Subjective:   Nicolas Krause, male    DOB: [birth year], 71 y.o.   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 He is back to his baseline physical activity, without any complaints of chest pain, dyspnea. Reviewed recent test results with the patient, details below.   Discharge summary 01/02/2022: Culprit lesion in mid LAD was successfully treated with primary PCI, details below.  Patient has diffuse and at least moderate residual disease in ostial to proximal LAD.  Interventional management was deferred to avoid contrast-induced nephropathy in the setting of underlying CKD.  Echocardiogram showed severe LAD territory hypokinesis with EF of 20-25%.  BNP was elevated at 700.  However, patient was clinically very well compensated and ambulated without any chest pain or shortness of breath.  Further inpatient management was discussed, but patient was very keen on discharge home, with close follow-up.  Given that he is fairly asymptomatic, and is very reliable for follow-up, this is reasonable.  I resumed his ARB on discharge, hydrocele losartan 12.5 mg daily during the hospitalization.  I added p.o. 40 mg Lasix daily on discharge, and resume home Jardiance.  Given low normal blood pressure, and low EF, I held beta-blocker, and also held spironolactone initiation given increasing creatinine and potassium.  I will resume this during close outpatient follow-up, scheduled on 01/05/2022.  Patient will obtain BMP on 01/04/2022.  He was discharged on dual antiplatelet therapy aspirin, Brilinta, Crestor 10 mg daily.  He is also participating in clinical trial eval-MI for early initiation of Repatha.   Lipoprotein a is pending. Given relatively low LDL, suspicion for elevated lipoprotein (a), I chose to use Crestor 10, rather than 20 mg daily. I will add beta blocker outpatient after further diuresis. Finally, LifeVest was recommended given anterior MI and EF 20-25%.  Once approved, this will be delivered to his home, hopefully later today.  Patient is here with his son [name] today. He has not had any chest pain, shortness of breath since discharge. Today, he was able to climb a flight of 20 stairs without any difficulty or symptoms. Reviewed BMP labs from 8/30 with the patient, details below.     Current Outpatient Medications:    ascorbic acid (VITAMIN C) 500 MG tablet, Take 500 mg by mouth daily., Disp: , Rfl:    aspirin EC 81 MG tablet, Take 1 tablet (81 mg total) by mouth daily. Swallow whole., Disp: 30 tablet, Rfl: 1   Azilsartan Medoxomil (EDARBI) 80 MG TABS, Take 0.5 tablets (40 mg total) by mouth daily., Disp: 1 tablet, Rfl: 0   Cholecalciferol (VITAMIN D3) 25 MCG (1000 UT) CAPS, Take 1,000 Units by mouth daily., Disp: , Rfl:    dexlansoprazole (DEXILANT) 60 MG capsule, Take 60 mg by mouth daily., Disp: , Rfl:    empagliflozin (JARDIANCE) 25 MG TABS tablet, Take 25 mg by mouth daily., Disp: , Rfl:    Evolocumab (REPATHA SURECLICK) 159 MG/ML SOAJ, Inject 140 mg into the skin every 14 (fourteen) days., Disp: 6 mL, Rfl: 5   insulin degludec (TRESIBA FLEXTOUCH) 100 UNIT/ML FlexTouch Pen, Inject 30 Units into the skin at bedtime., Disp: 9 mL, Rfl: 1  metoprolol succinate (TOPROL-XL) 50 MG 24 hr tablet, Take 1 tablet (50 mg total) by mouth daily. Take with or immediately following a meal., Disp: 90 tablet, Rfl: 0   nitroGLYCERIN (NITROSTAT) 0.4 MG SL tablet, Place 1 tablet (0.4 mg total) under the tongue every 5 (five) minutes x 3 doses as needed for chest pain., Disp: 25 tablet, Rfl: 1   rosuvastatin (CRESTOR) 10 MG tablet, Take 1 tablet (10 mg total) by mouth daily., Disp: 30 tablet, Rfl:  1   Semaglutide (OZEMPIC, 0.25 OR 0.5 MG/DOSE, [place]), Inject 0.5 mg into the skin every Sunday., Disp: , Rfl:    ticagrelor (BRILINTA) 90 MG TABS tablet, Take 1 tablet (90 mg total) by mouth 2 (two) times daily., Disp: 60 tablet, Rfl: 1   Cardiovascular & other pertient studies:  Reviewed external labs and tests, independently interpreted  EKG 01/05/2022: Sinus rhythm 97 bpm LAFB Anterior infarct, age indeterminate Low voltage in precordial leads Poor R-wave progression  Echocardiogram 01/01/2022:  1. No LV thrombus. Left ventricular ejection fraction, by estimation, is  20 to 25%. The left ventricle has severely decreased function. The left  ventricle demonstrates regional wall motion abnormalities (see scoring  diagram/findings for description).  Indeterminate diastolic filling due to E-A fusion. There is akinesis of  the left ventricular, mid-apical anterior wall, anteroseptal wall and  apical segment. There is severe hypokinesis of the left ventricular,  apical inferolateral wall.   2. Right ventricular systolic function is mildly reduced. The right  ventricular size is normal. There is normal pulmonary artery systolic  pressure.   3. The mitral valve is normal in structure. Mild mitral valve  regurgitation. No evidence of mitral stenosis.   4. The aortic valve is normal in structure. Aortic valve regurgitation is  not visualized. No aortic stenosis is present.    Coronary intervention 01/01/2022: LM: Diffuse 40% disease, MLA 7.3 mm2 LAD: Diffuse prox 50% disease, MLA 4.0 mm2          Mid 95% stenosis (Culprit lesion), followed by 40% disease Lcx: Large, dominant        Prox 20%, OM2 ostial 20%, OM# ostial 30% disease RCA: Small, non-dominant. No significant disease   LVEDP 24 mmHg   Successful percutaneous coronary intervention mid LAD     IVUS guided PTCA and stent placement Synergy 2.5 X 24 mm Synergy drug-eluting stent     0% culprit stenosis     TIMI flow  I-->III Will consider re-look angiography or stress testing in a few days to address prox LAD disease.  Recent labs: 02/15/2022: Glucose 96, BUN/Cr 30/1.6. EGFR 45. Na/K 139/5.4. Rest of the CMP normal H/H 12.8/36.9. MCV 91.9. Platelets 272  01/10/2022: Glucose 154, BUN/Cr 28/1.6. EGFR 45. Na/K 137/4.8. Rest of the CMP normal  01/04/2022: Glucose 265, BUN/Cr 67/2.67. EGFR 24. Na/K 133/4.9. Rest of the CMP normal H/H 13/37. MCV 90. Platelets 224  01/02/2022: Glucose 152, BUN/Cr 37/2.0. EGFR 35. Na/K 140/5.1. Rest of the CMP normal H/H 15/41. MCV 87. Platelets 230 HbA1C 5.0% Chol 136, TG 166, HDL 45, LDL 58 Lipoprotein (a) 87 BNP 763 Trop HS 11,490    Review of Systems  Cardiovascular:  Negative for chest pain, dyspnea on exertion, leg swelling, palpitations and syncope.         Vitals:   02/15/22 1458  BP: (!) 150/75  Pulse: 70  Resp: 16  SpO2: 100%    Body mass index is 22.31 kg/m. Filed Weights   02/15/22 1458  Weight: 122  lb (55.3 kg)    Objective:   Physical Exam Vitals and nursing note reviewed.  Constitutional:      General: He is not in acute distress. Neck:     Vascular: No JVD.  Cardiovascular:     Rate and Rhythm: Normal rate and regular rhythm.     Heart sounds: Normal heart sounds. No murmur heard. Pulmonary:     Effort: Pulmonary effort is normal.     Breath sounds: Normal breath sounds. No wheezing or rales.  Musculoskeletal:     Right lower leg: No edema.     Left lower leg: No edema.          Visit diagnoses:   ICD-10-CM   1. Coronary artery disease involving native coronary artery of native heart without angina pectoris  I25.10 ECHOCARDIOGRAM COMPLETE    CANCELED: PCV ECHOCARDIOGRAM COMPLETE    2. 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 BNP pending. Unable to use most GDMT due to renal dysfunction. Continue Edarbi at 40 mg daily. With Cr 1.6, K 5.4, I am still avoiding ARNI/MRA. Continue metoprolol succinate 50 mg daily. Continue Jardiance-on 25 mg daily, as long as GFR>20. Continue Lifevest. Will check echocardiogram in mid November I am cautiously optimistic that his EF would have improved.   CAD: STEMI 8/203, treated with mid LAD PCI. Residual diffuse at least moderate disease in prox LAD. Given absence of angina symptoms and underlying renal dysfunction, prefer medical management at this time. Continue DAPT with Aspirin and Brilinta at least till 12/2022. Continue statin and Repatha. LDL 58, lipoprotein (a) 87.  F/u in 4 weeks    Nigel Mormon, MD Pager: [phone] Office: [phone]

## 2022-02-17 ENCOUNTER — Encounter: Payer: Self-pay | Admitting: Cardiology

## 2022-02-21 ENCOUNTER — Ambulatory Visit (HOSPITAL_COMMUNITY)
Admission: RE | Admit: 2022-02-21 | Discharge: 2022-02-21 | Disposition: A | Discharge status: Home / Self Care | Payer: Medicare Other | Source: Ambulatory Visit | Attending: Cardiology | Admitting: Cardiology

## 2022-02-21 DIAGNOSIS — I252 Old myocardial infarction: Secondary | ICD-10-CM | POA: Diagnosis not present

## 2022-02-21 DIAGNOSIS — I081 Rheumatic disorders of both mitral and tricuspid valves: Secondary | ICD-10-CM | POA: Diagnosis not present

## 2022-02-21 DIAGNOSIS — E119 Type 2 diabetes mellitus without complications: Secondary | ICD-10-CM | POA: Diagnosis not present

## 2022-02-21 DIAGNOSIS — I2510 Atherosclerotic heart disease of native coronary artery without angina pectoris: Secondary | ICD-10-CM | POA: Diagnosis not present

## 2022-02-21 DIAGNOSIS — I5020 Unspecified systolic (congestive) heart failure: Secondary | ICD-10-CM | POA: Diagnosis not present

## 2022-02-21 LAB — ECHOCARDIOGRAM COMPLETE
Area-P 1/2: 4.49 cm2
Calc EF: 52.4 %
S' Lateral: 2.2 cm
Single Plane A2C EF: 50.9 %
Single Plane A4C EF: 54.2 %

## 2022-02-21 NOTE — Progress Notes (Signed)
Echocardiogram 2D Echocardiogram has been performed.  Nicolas Krause 02/21/2022, 3:56 PM

## 2022-02-23 NOTE — Progress Notes (Signed)
Called pt to inform him that he dose not need the life vest anymore. Pt understood

## 2022-02-23 NOTE — Progress Notes (Signed)
I conveyed the results to the patient. LifeVest not needed anymore.  Thanks MJP

## 2022-03-02 ENCOUNTER — Other Ambulatory Visit: Payer: Self-pay | Admitting: Cardiology

## 2022-03-02 DIAGNOSIS — I2510 Atherosclerotic heart disease of native coronary artery without angina pectoris: Secondary | ICD-10-CM

## 2022-03-02 NOTE — Progress Notes (Signed)
Please schedule exercise nuclear stress test  Thanks MJP

## 2022-03-03 ENCOUNTER — Other Ambulatory Visit: Payer: Self-pay

## 2022-03-03 MED ORDER — TICAGRELOR 90 MG PO TABS: 90.0000 mg | ORAL_TABLET | Freq: Two times a day (BID) | ORAL | 1 refills | Status: DC

## 2022-03-08 ENCOUNTER — Other Ambulatory Visit: Payer: Self-pay

## 2022-03-08 MED ORDER — ROSUVASTATIN CALCIUM 10 MG PO TABS: 10.0000 mg | ORAL_TABLET | Freq: Every day | ORAL | 5 refills | Status: DC

## 2022-03-28 ENCOUNTER — Ambulatory Visit: Payer: Medicare Other

## 2022-03-28 DIAGNOSIS — I2510 Atherosclerotic heart disease of native coronary artery without angina pectoris: Secondary | ICD-10-CM

## 2022-04-04 ENCOUNTER — Telehealth (HOSPITAL_COMMUNITY): Payer: Self-pay

## 2022-04-04 ENCOUNTER — Encounter (HOSPITAL_COMMUNITY): Payer: Self-pay

## 2022-04-04 ENCOUNTER — Other Ambulatory Visit: Payer: Self-pay | Admitting: Cardiology

## 2022-04-04 DIAGNOSIS — I10 Essential (primary) hypertension: Secondary | ICD-10-CM

## 2022-04-04 MED ORDER — AMLODIPINE BESYLATE 5 MG PO TABS: 5.0000 mg | ORAL_TABLET | Freq: Every day | ORAL | 3 refills | Status: DC

## 2022-04-04 NOTE — Progress Notes (Signed)
03/25/2022: Glucose 104, BUN/Cr 34/1.9. EGFR 36. Na/K 138/5.7. AST/ALT 55/110. Rest of the CMP normal H/H 11.7/34.4. MCV 89. Platelets 280 HbA1C 4.9% Chol 74, TG 79, HDL 46, LDL 12  Hold Edarbi, discontinue Crestor. Will repeat BMP labs in a week.  Discussed with the patient.   Manish J Patwardhan, MD  

## 2022-04-04 NOTE — Progress Notes (Signed)
Given that blood pressure maybe elevated after stopping Edarbi, I have added amlodipine 5 mg daily.  Elder Negus, MD

## 2022-04-04 NOTE — Telephone Encounter (Signed)
Attempted to call patient in regards to Cardiac Rehab - LM on VM Mailed letter 

## 2022-04-05 ENCOUNTER — Other Ambulatory Visit: Payer: Medicare Other

## 2022-04-12 ENCOUNTER — Ambulatory Visit: Payer: Medicare Other | Admitting: Cardiology

## 2022-04-12 ENCOUNTER — Telehealth (HOSPITAL_COMMUNITY): Payer: Self-pay

## 2022-04-12 ENCOUNTER — Encounter: Payer: Self-pay | Admitting: Cardiology

## 2022-04-12 VITALS — BP 146/66 | HR 70 | Resp 16 | Ht 62.0 in | Wt 121.2 lb

## 2022-04-12 DIAGNOSIS — I2510 Atherosclerotic heart disease of native coronary artery without angina pectoris: Secondary | ICD-10-CM | POA: Diagnosis not present

## 2022-04-12 DIAGNOSIS — E7841 Elevated Lipoprotein(a): Secondary | ICD-10-CM

## 2022-04-12 DIAGNOSIS — E782 Mixed hyperlipidemia: Secondary | ICD-10-CM | POA: Insufficient documentation

## 2022-04-12 DIAGNOSIS — I249 Acute ischemic heart disease, unspecified: Secondary | ICD-10-CM | POA: Diagnosis not present

## 2022-04-12 DIAGNOSIS — N1831 Chronic kidney disease, stage 3a: Secondary | ICD-10-CM | POA: Insufficient documentation

## 2022-04-12 DIAGNOSIS — I10 Essential (primary) hypertension: Secondary | ICD-10-CM | POA: Diagnosis not present

## 2022-04-12 NOTE — Progress Notes (Signed)
Follow up visit  Subjective:   Nicolas Krause, male    DOB: 17-Jul-1950, 71 y.o.   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 He admits that he has slacked off on exercise a bit lately, but denies any chest pain, dyspnea symptoms. Recently, I had discontinued hi Edarbi due to Cr up to 1.9, K up to 5.7. I started amlodipine 5 mg daily for blood pressure control. BP has been in 120s-140s/80s. Since starting Repatha, he has stopped rosuvastatin.   Discharge summary 01/02/2022: Culprit lesion in mid LAD was successfully treated with primary PCI, details below.  Patient has diffuse and at least moderate residual disease in ostial to proximal LAD.  Interventional management was deferred to avoid contrast-induced nephropathy in the setting of underlying CKD.  Echocardiogram showed severe LAD territory hypokinesis with EF of 20-25%.  BNP was elevated at 700.  However, patient was clinically very well compensated and ambulated without any chest pain or shortness of breath.  Further inpatient management was discussed, but patient was very keen on discharge home, with close follow-up.  Given that he is fairly asymptomatic, and is very reliable for follow-up, this is reasonable.  I resumed his ARB on discharge, hydrocele losartan 12.5 mg daily during the hospitalization.  I added p.o. 40 mg Lasix daily on discharge, and resume home Jardiance.  Given low normal blood pressure, and low EF, I held beta-blocker, and also held spironolactone initiation given increasing creatinine and potassium.  I will resume this during close outpatient follow-up, scheduled on 01/05/2022.  Patient will obtain BMP on 01/04/2022.   He was discharged on dual antiplatelet therapy aspirin, Brilinta, Crestor 10 mg daily.  He is also participating in clinical trial eval-MI for early initiation of Repatha.  Lipoprotein a is pending. Given relatively low LDL, suspicion for elevated lipoprotein (a), I chose to use Crestor 10, rather than 20 mg daily. I will add beta blocker outpatient after further diuresis. Finally, LifeVest was recommended given anterior MI and EF 20-25%.  Once approved, this will be delivered to his home, hopefully later today.  Patient is here with his son Nicolas Krause today. He has not had any chest pain, shortness of breath since discharge. Today, he was able to climb a flight of 20 stairs without any difficulty or symptoms. Reviewed BMP labs from 8/30 with the patient, details below.     Current Outpatient Medications:    amLODipine (NORVASC) 5 MG tablet, Take 1 tablet (5 mg total) by mouth daily., Disp: 90 tablet, Rfl: 3   ascorbic acid (VITAMIN C) 500 MG tablet, Take 500 mg by mouth daily., Disp: , Rfl:    aspirin EC 81 MG tablet, Take 1 tablet (81 mg total) by mouth daily. Swallow whole., Disp: 30 tablet, Rfl: 1   Cholecalciferol (VITAMIN D3) 25 MCG (1000 UT) CAPS, Take 1,000 Units by mouth daily., Disp: , Rfl:    dexlansoprazole (DEXILANT) 60 MG capsule, Take 60 mg by mouth daily., Disp: , Rfl:    empagliflozin (JARDIANCE) 25 MG TABS tablet, Take 25 mg by mouth daily., Disp: , Rfl:    Evolocumab (REPATHA SURECLICK) 725 MG/ML SOAJ, Inject 140 mg into the skin  every 14 (fourteen) days., Disp: 6 mL, Rfl: 5   insulin degludec (TRESIBA FLEXTOUCH) 100 UNIT/ML FlexTouch Pen, Inject 30 Units into the skin at bedtime., Disp: 9 mL, Rfl: 1   metoprolol succinate (TOPROL-XL) 50 MG 24 hr tablet, Take 1 tablet (50 mg total) by mouth daily. Take with or immediately following a meal., Disp: 90 tablet, Rfl: 0   nitroGLYCERIN (NITROSTAT) 0.4 MG SL tablet, Place 1 tablet (0.4 mg total) under the tongue every 5 (five) minutes x 3  doses as needed for chest pain., Disp: 25 tablet, Rfl: 1   Semaglutide (OZEMPIC, 0.25 OR 0.5 MG/DOSE, ), Inject 0.5 mg into the skin every Sunday., Disp: , Rfl:    ticagrelor (BRILINTA) 90 MG TABS tablet, Take 1 tablet (90 mg total) by mouth 2 (two) times daily., Disp: 60 tablet, Rfl: 1   Cardiovascular & other pertient studies:  Reviewed external labs and tests, independently interpreted  Exercise nuclear stress test 03/28/2022: Myocardial perfusion is normal. Overall LV systolic function is normal without regional wall motion abnormalities. Stress LV EF: 62%.  Normal ECG stress. The patient exercised for 6 minutes and 16 seconds of a Bruce protocol, achieving approximately 7.44 METs & 106% MPHR. Stress terminated due to THR achieved and fatigue. The blood pressure response was normal. No previous exam available for comparison. Low risk.    Echocardiogram 02/21/2022: 1. Left ventricular ejection fraction, by estimation, is 50 to 55%. The  left ventricle has normal function. The left ventricle has no regional  wall motion abnormalities. Left ventricular diastolic parameters are  consistent with Grade I diastolic  dysfunction (impaired relaxation).   2. Right ventricular systolic function is normal. The right ventricular  size is normal. There is normal pulmonary artery systolic pressure.   3. The mitral valve is normal in structure. Mild mitral valve  regurgitation. No evidence of mitral stenosis.   4. The aortic valve is normal in structure. Aortic valve regurgitation is  not visualized. No aortic stenosis is present.   Comparison(s): A prior study was performed on 01/01/2022. Changes from  prior study are noted. Wl motion abnormalities have resolved. LVEF has  increased from 20-25%.   EKG 01/05/2022: Sinus rhythm 97 bpm LAFB Anterior infarct, age indeterminate Low voltage in precordial leads Poor R-wave progression   Coronary intervention 01/01/2022: LM: Diffuse 40% disease,  MLA 7.3 mm2 LAD: Diffuse prox 50% disease, MLA 4.0 mm2          Mid 95% stenosis (Culprit lesion), followed by 40% disease Lcx: Large, dominant        Prox 20%, OM2 ostial 20%, OM# ostial 30% disease RCA: Small, non-dominant. No significant disease   LVEDP 24 mmHg   Successful percutaneous coronary intervention mid LAD     IVUS guided PTCA and stent placement Synergy 2.5 X 24 mm Synergy drug-eluting stent     0% culprit stenosis     TIMI flow I-->III Will consider re-look angiography or stress testing in a few days to address prox LAD disease.  Recent labs: 04/11/2022: Glucose 112, BUN/Cr 26/1.6. EGFR 45. Na/K 139/5.2. Rest of the CMP normal H/H 11/34. MCV 87. Platelets 291 HbA1C NA Chol 97, TG 79, HDL 52, LDL 29 TSH NA  03/25/2022: Glucose 104, BUN/Cr 34/1.9. EGFR 36. Na/K 138/5.7. AST/ALT 55/110. Rest of the CMP normal H/H 11.7/34.4. MCV 89. Platelets 280 HbA1C 4.9% Chol 74, TG 79, HDL 46, LDL 12   02/15/2022: Glucose 96, BUN/Cr 30/1.6. EGFR 45. Na/K 139/5.4. Rest of the CMP  normal H/H 12.8/36.9. MCV 91.9. Platelets 272  01/10/2022: Glucose 154, BUN/Cr 28/1.6. EGFR 45. Na/K 137/4.8. Rest of the CMP normal  01/04/2022: Glucose 265, BUN/Cr 67/2.67. EGFR 24. Na/K 133/4.9. Rest of the CMP normal H/H 13/37. MCV 90. Platelets 224  01/02/2022: Glucose 152, BUN/Cr 37/2.0. EGFR 35. Na/K 140/5.1. Rest of the CMP normal H/H 15/41. MCV 87. Platelets 230 HbA1C 5.0% Chol 136, TG 166, HDL 45, LDL 58 Lipoprotein (a) 87 BNP 763 Trop HS 11,490    Review of Systems  Cardiovascular:  Negative for chest pain, dyspnea on exertion, leg swelling, palpitations and syncope.         Vitals:   04/12/22 1444  BP: (!) 146/66  Pulse: 70  Resp: 16  SpO2: 100%    Body mass index is 22.17 kg/m. Filed Weights   04/12/22 1444  Weight: 121 lb 3.2 oz (55 kg)    Objective:   Physical Exam Vitals and nursing note reviewed.  Constitutional:      General: He is not in acute  distress. Neck:     Vascular: No JVD.          Visit diagnoses:   ICD-10-CM   1. Coronary artery disease involving native coronary artery of native heart without angina pectoris  I25.10     2. Essential hypertension  I10     3. Elevated lipoprotein(a)  E78.41     4. 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 Lifevest discontinued. Unable to use most GDMT due to renal dysfunction. Earnest Rosier currently on hold due to fluctuating Cr 1.6-1.9, K 5.2-5.7. Continue metoprolol succinate 50 mg daily. Continue Jardiance-on 25 mg daily, as long as GFR>20.  CAD: STEMI 8/203, treated with mid LAD PCI. Residual diffuse at least moderate disease in prox LAD. No ischemia on stress testing (03/2022). Continue medical management at this time. Continue DAPT with Aspirin and Brilinta at least till 12/2022. Continue Repatha, statin now discontinued. It is beneficial for him to be on Repatha alone given LDL is well controlled on it, and statin can in fact increase lipoprotein (a), which was mildly elevated in him.   CKD: Earnest Rosier currently on hold due to fluctuating Cr 1.6-1.9, K 5.2-5.7. He will see nephrologist Dr. [name].  Hypertension: No change made today. Increase physical exercise. Will defer to Dr. [name] re: resuming Earnest Rosier, which will help lower blood pressure.   F/u in 3 months     Nigel Mormon, MD Pager: [phone] Office: [phone]

## 2022-04-12 NOTE — Telephone Encounter (Signed)
Recv'ed phone call from Lurena Joiner who stated she is the pt assistant. Pt stated per his doctor his stress test is fine and does not need CR.   Closed referral

## 2022-04-13 ENCOUNTER — Encounter: Payer: Self-pay | Admitting: Cardiology

## 2022-04-13 NOTE — Progress Notes (Signed)
Labs

## 2022-04-25 ENCOUNTER — Other Ambulatory Visit: Payer: Self-pay

## 2022-04-25 MED ORDER — METOPROLOL SUCCINATE ER 50 MG PO TB24: 50.0000 mg | ORAL_TABLET | Freq: Every day | ORAL | 0 refills | Status: DC

## 2022-05-02 ENCOUNTER — Other Ambulatory Visit: Payer: Self-pay | Admitting: Cardiology

## 2022-06-13 NOTE — Progress Notes (Signed)
Please change f/u appt to me  Thanks MJP

## 2022-07-12 ENCOUNTER — Encounter: Payer: Self-pay | Admitting: Cardiology

## 2022-07-12 ENCOUNTER — Ambulatory Visit: Payer: Medicare Other | Admitting: Cardiology

## 2022-07-12 VITALS — BP 145/64 | HR 87 | Ht 62.0 in | Wt 114.0 lb

## 2022-07-12 DIAGNOSIS — E782 Mixed hyperlipidemia: Secondary | ICD-10-CM | POA: Diagnosis not present

## 2022-07-12 DIAGNOSIS — I2510 Atherosclerotic heart disease of native coronary artery without angina pectoris: Secondary | ICD-10-CM

## 2022-07-12 DIAGNOSIS — I10 Essential (primary) hypertension: Secondary | ICD-10-CM | POA: Diagnosis not present

## 2022-07-12 DIAGNOSIS — E7841 Elevated Lipoprotein(a): Secondary | ICD-10-CM | POA: Diagnosis not present

## 2022-07-12 NOTE — Progress Notes (Signed)
Follow up visit  Subjective:   Nicolas Krause, male    DOB: [birth year], 72 y.o.   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 Since then, he has felt fatigued, but is slowly increasing his physical activity. Blood pressure remains elevated. He is going to see nephrologist Dr. Elmarie Krause soon.   Discharge summary 01/02/2022: Culprit lesion in mid LAD was successfully treated with primary PCI, details below.  Patient has diffuse and at least moderate residual disease in ostial to proximal LAD.  Interventional management was deferred to avoid contrast-induced nephropathy in the setting of underlying CKD.  Echocardiogram showed severe LAD territory hypokinesis with EF of 20-25%.  BNP was elevated at 700.  However, patient was clinically very well compensated and ambulated without any chest pain or shortness of breath.  Further inpatient management was discussed, but patient was very keen on discharge home, with close follow-up.  Given that he is fairly asymptomatic, and is very reliable for follow-up, this is reasonable.  I resumed his ARB on discharge, hydrocele losartan 12.5 mg daily during the hospitalization.  I added p.o. 40 mg Lasix daily on discharge, and resume home Jardiance.  Given low normal blood pressure, and low EF, I held beta-blocker, and also held spironolactone initiation given increasing creatinine and potassium.  I will resume this during close outpatient follow-up, scheduled on 01/05/2022.  Patient will obtain BMP on 01/04/2022.  He was discharged on dual antiplatelet therapy aspirin, Brilinta, Crestor 10 mg daily.  He is also  participating in clinical trial eval-MI for early initiation of Repatha.  Lipoprotein a is pending. Given relatively low LDL, suspicion for elevated lipoprotein (a), I chose to use Crestor 10, rather than 20 mg daily. I will add beta blocker outpatient after further diuresis. Finally, LifeVest was recommended given anterior MI and EF 20-25%.  Once approved, this will be delivered to his home, hopefully later today.  Patient is here with his son Nicolas Krause today. He has not had any chest pain, shortness of breath since discharge. Today, he was able to climb a flight of 20 stairs without any difficulty or symptoms. Reviewed BMP labs from 8/30 with the patient, details below.     Current Outpatient Medications:    amLODipine (NORVASC) 5 MG tablet, Take 1 tablet (5 mg total) by mouth daily., Disp: 90 tablet, Rfl: 3   ascorbic acid (VITAMIN C) 500 MG tablet, Take 500 mg by mouth daily., Disp: , Rfl:    aspirin EC 81 MG tablet, Take 1 tablet (81 mg total) by mouth daily. Swallow whole., Disp: 30 tablet, Rfl: 1   BRILINTA 90 MG TABS tablet, TAKE 1 TABLET BY MOUTH 2 TIMES DAILY., Disp: 60 tablet, Rfl: 1   Cholecalciferol (VITAMIN D3) 25 MCG (1000 UT) CAPS, Take 1,000 Units by mouth daily., Disp: , Rfl:    dexlansoprazole (DEXILANT) 60 MG capsule, Take 60 mg by mouth daily., Disp: , Rfl:    empagliflozin (JARDIANCE) 25 MG TABS tablet, Take 25 mg by mouth daily., Disp: , Rfl:    Evolocumab (REPATHA SURECLICK) [id number] MG/ML SOAJ, Inject 140 mg into the skin every 14 (fourteen) days.,  Disp: 6 mL, Rfl: 5   insulin degludec (TRESIBA FLEXTOUCH) 100 UNIT/ML FlexTouch Pen, Inject 30 Units into the skin at bedtime., Disp: 9 mL, Rfl: 1   metoprolol succinate (TOPROL-XL) 50 MG 24 hr tablet, Take 1 tablet (50 mg total) by mouth daily. Take with or immediately following a meal., Disp: 90 tablet, Rfl: 0   nitroGLYCERIN (NITROSTAT) 0.4 MG SL tablet, Place 1 tablet (0.4 mg total) under the tongue every 5 (five) minutes x 3 doses as  needed for chest pain., Disp: 25 tablet, Rfl: 1   Semaglutide (OZEMPIC, 0.25 OR 0.5 MG/DOSE, [place]), Inject 0.5 mg into the skin every Sunday., Disp: , Rfl:    Cardiovascular & other pertient studies:  Reviewed external labs and tests, independently interpreted  EKG 07/12/2022: Sinus rhythm Old anteroseptal infarct  Exercise nuclear stress test 03/28/2022: Myocardial perfusion is normal. Overall LV systolic function is normal without regional wall motion abnormalities. Stress LV EF: 62%.  Normal ECG stress. The patient exercised for 6 minutes and 16 seconds of a Bruce protocol, achieving approximately 7.44 METs & 106% MPHR. Stress terminated due to THR achieved and fatigue. The blood pressure response was normal. No previous exam available for comparison. Low risk.    Echocardiogram 02/21/2022: 1. Left ventricular ejection fraction, by estimation, is 50 to 55%. The  left ventricle has normal function. The left ventricle has no regional  wall motion abnormalities. Left ventricular diastolic parameters are  consistent with Grade I diastolic  dysfunction (impaired relaxation).   2. Right ventricular systolic function is normal. The right ventricular  size is normal. There is normal pulmonary artery systolic pressure.   3. The mitral valve is normal in structure. Mild mitral valve  regurgitation. No evidence of mitral stenosis.   4. The aortic valve is normal in structure. Aortic valve regurgitation is  not visualized. No aortic stenosis is present.   Comparison(s): A prior study was performed on 01/01/2022. Changes from  prior study are noted. Wl motion abnormalities have resolved. LVEF has  increased from 20-25%.    Coronary intervention 01/01/2022: LM: Diffuse 40% disease, MLA 7.3 mm2 LAD: Diffuse prox 50% disease, MLA 4.0 mm2          Mid 95% stenosis (Culprit lesion), followed by 40% disease Lcx: Large, dominant        Prox 20%, OM2 ostial 20%, OM# ostial 30% disease RCA: Small,  non-dominant. No significant disease   LVEDP 24 mmHg   Successful percutaneous coronary intervention mid LAD     IVUS guided PTCA and stent placement Synergy 2.5 X 24 mm Synergy drug-eluting stent     0% culprit stenosis     TIMI flow I-->III Will consider re-look angiography or stress testing in a few days to address prox LAD disease.  Recent labs: 06/09/2022: Glucose 147, BUN/Cr 26/1.5. EGFR 48. Na/K 139/4.7. Rest of the CMP normal Chol 96, TG 94, HDL 49, LDL 28  01/02/2022: Glucose 152, BUN/Cr 37/2.0. EGFR 35. Na/K 140/5.1. Rest of the CMP normal H/H 15/41. MCV 87. Platelets 230 HbA1C 5.0% Chol 136, TG 166, HDL 45, LDL 58 Lipoprotein (a) 87 BNP 763 Trop HS 11,490    Review of Systems  Constitutional: Positive for malaise/fatigue.  Cardiovascular:  Negative for chest pain, dyspnea on exertion, leg swelling, palpitations and syncope.         Vitals:   07/12/22 1504 07/12/22 1507  BP: (!) 152/63 (!) 145/64  Pulse: 92 87  SpO2: 100%     Body mass  index is 20.85 kg/m. Filed Weights   07/12/22 1504  Weight: 114 lb (51.7 kg)    Objective:   Physical Exam Vitals and nursing note reviewed.  Constitutional:      General: He is not in acute distress. Neck:     Vascular: No JVD.  Musculoskeletal:     Right lower leg: No edema.     Left lower leg: No edema.          Visit diagnoses:   ICD-10-CM   1. Coronary artery disease involving native coronary artery of native heart without angina pectoris  I25.10 EKG 12-Lead    2. Essential hypertension  I10     3. Elevated lipoprotein(a)  E78.41     4. 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 Unable to use  most GDMT due to renal dysfunction. Earnest Rosier currently on hold due to fluctuating Cr 1.6-1.9, K 5.2-5.7. Continue metoprolol succinate 50 mg daily. Continue Jardiance-on 25 mg daily, as long as GFR>20.  CAD: STEMI 8/203, treated with mid LAD PCI. Residual diffuse at least moderate disease in prox LAD. No ischemia on stress testing (03/2022). Continue medical management at this time. Continue DAPT with Aspirin and Brilinta at least till 12/2022. Continue Repatha, statin now discontinued. It is beneficial for him to be on Repatha alone given LDL is well controlled on it, and statin can in fact increase lipoprotein (a), which was mildly elevated in him.   CKD: Earnest Rosier currently on hold due to fluctuating Cr 1.6-1.9, K 5.2-5.7. He will see nephrologist Dr. Elmarie Krause soon.  Hypertension: No change made today. Increase physical exercise. Will defer to Dr. Elmarie Krause re: resuming Earnest Rosier, which will help lower blood pressure.   F/u in 3 months     Nigel Mormon, MD Pager: [phone] Office: [phone]

## 2022-07-13 DIAGNOSIS — I2510 Atherosclerotic heart disease of native coronary artery without angina pectoris: Secondary | ICD-10-CM | POA: Diagnosis not present

## 2022-07-13 DIAGNOSIS — R531 Weakness: Secondary | ICD-10-CM | POA: Diagnosis not present

## 2022-07-13 DIAGNOSIS — N183 Chronic kidney disease, stage 3 unspecified: Secondary | ICD-10-CM | POA: Diagnosis not present

## 2022-07-13 DIAGNOSIS — E1122 Type 2 diabetes mellitus with diabetic chronic kidney disease: Secondary | ICD-10-CM | POA: Diagnosis not present

## 2022-07-13 DIAGNOSIS — R0602 Shortness of breath: Secondary | ICD-10-CM | POA: Diagnosis not present

## 2022-07-14 NOTE — Progress Notes (Signed)
Reviewed. New anemia with 6 g drop. Hold Aspirin. Continue Brilinta. Patient will be following up with GI Dr. Collene Mares. If Brilinta needs to be held for EGD or colonoscopy, reduce the interruption to 3-5 days if possible.    Nigel Mormon, MD Pager: [phone] Office: [phone]

## 2022-07-17 DIAGNOSIS — N2581 Secondary hyperparathyroidism of renal origin: Secondary | ICD-10-CM | POA: Diagnosis not present

## 2022-07-17 DIAGNOSIS — D631 Anemia in chronic kidney disease: Secondary | ICD-10-CM | POA: Diagnosis not present

## 2022-07-17 DIAGNOSIS — I129 Hypertensive chronic kidney disease with stage 1 through stage 4 chronic kidney disease, or unspecified chronic kidney disease: Secondary | ICD-10-CM | POA: Diagnosis not present

## 2022-07-17 DIAGNOSIS — N1831 Chronic kidney disease, stage 3a: Secondary | ICD-10-CM | POA: Diagnosis not present

## 2022-07-19 ENCOUNTER — Telehealth: Payer: Self-pay | Admitting: Cardiology

## 2022-07-19 DIAGNOSIS — R195 Other fecal abnormalities: Secondary | ICD-10-CM | POA: Diagnosis not present

## 2022-07-19 DIAGNOSIS — D509 Iron deficiency anemia, unspecified: Secondary | ICD-10-CM | POA: Diagnosis not present

## 2022-07-19 DIAGNOSIS — I2510 Atherosclerotic heart disease of native coronary artery without angina pectoris: Secondary | ICD-10-CM | POA: Diagnosis not present

## 2022-07-19 DIAGNOSIS — D500 Iron deficiency anemia secondary to blood loss (chronic): Secondary | ICD-10-CM | POA: Insufficient documentation

## 2022-07-19 DIAGNOSIS — E782 Mixed hyperlipidemia: Secondary | ICD-10-CM | POA: Diagnosis not present

## 2022-07-19 DIAGNOSIS — E119 Type 2 diabetes mellitus without complications: Secondary | ICD-10-CM | POA: Diagnosis not present

## 2022-07-19 DIAGNOSIS — I10 Essential (primary) hypertension: Secondary | ICD-10-CM | POA: Diagnosis not present

## 2022-07-19 DIAGNOSIS — J45909 Unspecified asthma, uncomplicated: Secondary | ICD-10-CM | POA: Diagnosis not present

## 2022-07-19 DIAGNOSIS — K219 Gastro-esophageal reflux disease without esophagitis: Secondary | ICD-10-CM | POA: Diagnosis not present

## 2022-07-19 NOTE — Telephone Encounter (Signed)
Recent dysphagia, 6 g Hb drop. Discussed w/Dr. Collene Mares, gastroenterologist. Risks Arelia Longest discussion reL DAPT use given MI in 12/2022. Recommend minimizing Brilinta interruption in light of need for urgent EGD. He will hold brilinta today, tomorrow, Friday morning and have EGD on Friday 3/15 afternoon. Dr. Collene Mares will touch base with me after EGD.   Nigel Mormon, MD Pager: [phone] Office: [phone]

## 2022-07-20 ENCOUNTER — Encounter: Payer: Self-pay | Admitting: Internal Medicine

## 2022-07-20 ENCOUNTER — Other Ambulatory Visit: Payer: Self-pay | Admitting: Internal Medicine

## 2022-07-20 ENCOUNTER — Encounter: Payer: Self-pay | Admitting: Cardiology

## 2022-07-20 DIAGNOSIS — N1831 Chronic kidney disease, stage 3a: Secondary | ICD-10-CM | POA: Diagnosis not present

## 2022-07-20 DIAGNOSIS — I10 Essential (primary) hypertension: Secondary | ICD-10-CM

## 2022-07-20 DIAGNOSIS — N2581 Secondary hyperparathyroidism of renal origin: Secondary | ICD-10-CM

## 2022-07-20 DIAGNOSIS — E1122 Type 2 diabetes mellitus with diabetic chronic kidney disease: Secondary | ICD-10-CM | POA: Diagnosis not present

## 2022-07-20 DIAGNOSIS — D631 Anemia in chronic kidney disease: Secondary | ICD-10-CM | POA: Diagnosis not present

## 2022-07-20 DIAGNOSIS — D500 Iron deficiency anemia secondary to blood loss (chronic): Secondary | ICD-10-CM | POA: Diagnosis not present

## 2022-07-20 DIAGNOSIS — E611 Iron deficiency: Secondary | ICD-10-CM | POA: Diagnosis not present

## 2022-07-21 DIAGNOSIS — K219 Gastro-esophageal reflux disease without esophagitis: Secondary | ICD-10-CM | POA: Diagnosis not present

## 2022-07-21 DIAGNOSIS — D509 Iron deficiency anemia, unspecified: Secondary | ICD-10-CM | POA: Diagnosis not present

## 2022-07-21 DIAGNOSIS — R195 Other fecal abnormalities: Secondary | ICD-10-CM | POA: Diagnosis not present

## 2022-07-21 DIAGNOSIS — K317 Polyp of stomach and duodenum: Secondary | ICD-10-CM | POA: Diagnosis not present

## 2022-07-24 ENCOUNTER — Telehealth: Payer: Self-pay

## 2022-07-24 ENCOUNTER — Other Ambulatory Visit: Payer: Self-pay | Admitting: Cardiology

## 2022-07-24 NOTE — Telephone Encounter (Signed)
Called and scheduled appt with Dr. Alvy Bimler for 3/22 at 1 pm, instructed to arrive 20 mins early to [facility]. He is aware of appt date/ time.

## 2022-07-26 ENCOUNTER — Encounter: Payer: Self-pay | Admitting: Cardiology

## 2022-07-27 DIAGNOSIS — N1831 Chronic kidney disease, stage 3a: Secondary | ICD-10-CM | POA: Diagnosis not present

## 2022-07-28 ENCOUNTER — Inpatient Hospital Stay: Payer: Medicare Other | Attending: Hematology and Oncology | Admitting: Hematology and Oncology

## 2022-07-28 ENCOUNTER — Other Ambulatory Visit: Payer: Self-pay

## 2022-07-28 ENCOUNTER — Encounter: Payer: Self-pay | Admitting: Hematology and Oncology

## 2022-07-28 VITALS — BP 132/47 | HR 80 | Temp 97.4°F | Resp 18 | Ht 62.0 in | Wt 120.2 lb

## 2022-07-28 DIAGNOSIS — Z7984 Long term (current) use of oral hypoglycemic drugs: Secondary | ICD-10-CM | POA: Diagnosis not present

## 2022-07-28 DIAGNOSIS — Z794 Long term (current) use of insulin: Secondary | ICD-10-CM | POA: Insufficient documentation

## 2022-07-28 DIAGNOSIS — I10 Essential (primary) hypertension: Secondary | ICD-10-CM | POA: Diagnosis not present

## 2022-07-28 DIAGNOSIS — Z79899 Other long term (current) drug therapy: Secondary | ICD-10-CM | POA: Diagnosis not present

## 2022-07-28 DIAGNOSIS — Z7902 Long term (current) use of antithrombotics/antiplatelets: Secondary | ICD-10-CM | POA: Insufficient documentation

## 2022-07-28 DIAGNOSIS — E785 Hyperlipidemia, unspecified: Secondary | ICD-10-CM | POA: Diagnosis not present

## 2022-07-28 DIAGNOSIS — D508 Other iron deficiency anemias: Secondary | ICD-10-CM

## 2022-07-28 DIAGNOSIS — D509 Iron deficiency anemia, unspecified: Secondary | ICD-10-CM | POA: Insufficient documentation

## 2022-07-28 DIAGNOSIS — E119 Type 2 diabetes mellitus without complications: Secondary | ICD-10-CM | POA: Insufficient documentation

## 2022-07-28 NOTE — Assessment & Plan Note (Signed)
I have reviewed his recent EGD and colonoscopy report There is no doubt in my mind that his anemia is due to GI blood loss The most likely cause of his anemia is due to chronic blood loss/malabsorption syndrome. We discussed some of the risks, benefits, and alternatives of intravenous iron infusions. The patient is symptomatic from anemia and the iron level is critically low. He tolerated oral iron supplement poorly and desires to achieved higher levels of iron faster for adequate hematopoesis. Some of the side-effects to be expected including risks of infusion reactions, phlebitis, headaches, nausea and fatigue.  The patient is willing to proceed. Patient education material was dispensed.  Goal is to keep ferritin level greater than 50 and resolution of anemia I recommend 2 doses of intravenous iron Feraheme I recommend repeat blood work approximately 6 weeks from the last day of infusion He has availability to get his labs checked in his office and will contact me with results I recommend multivitamin supplement for effective erythropoiesis For future follow-up, he is interested to monitor his blood count in his office He has my telephone number to call if he needs to come back again for further intravenous iron infusion

## 2022-07-28 NOTE — Progress Notes (Signed)
Nicolas Krause CONSULT NOTE  Patient Care Team: Patient, No Pcp Per as PCP - General (General Practice)  ASSESSMENT & PLAN:  Iron deficiency anemia I have reviewed his recent EGD and colonoscopy report There is no doubt in my mind that his anemia is due to GI blood loss The most likely cause of his anemia is due to chronic blood loss/malabsorption syndrome. We discussed some of the risks, benefits, and alternatives of intravenous iron infusions. The patient is symptomatic from anemia and the iron level is critically low. He tolerated oral iron supplement poorly and desires to achieved higher levels of iron faster for adequate hematopoesis. Some of the side-effects to be expected including risks of infusion reactions, phlebitis, headaches, nausea and fatigue.  The patient is willing to proceed. Patient education material was dispensed.  Goal is to keep ferritin level greater than 50 and resolution of anemia I recommend 2 doses of intravenous iron Feraheme I recommend repeat blood work approximately 6 weeks from the last day of infusion He has availability to get his labs checked in his office and will contact me with results I recommend multivitamin supplement for effective erythropoiesis For future follow-up, he is interested to monitor his blood count in his office He has my telephone number to call if he needs to come back again for further intravenous iron infusion No orders of the defined types were placed in this encounter.   All questions were answered. The patient knows to call the clinic with any problems, questions or concerns.  The total time spent in the appointment was 55 minutes encounter with patients including review of chart and various tests results, discussions about plan of care and coordination of care plan  Heath Lark, MD 3/22/20241:37 PM   CHIEF COMPLAINTS/PURPOSE OF CONSULTATION:  Anemia  HISTORY OF PRESENTING ILLNESS:  Nicolas Krause 72 y.o. male is  here because of anemia  He was found to have abnormal CBC from blood count monitoring I was able to review the electronic records and scanned results in the computer On 01/01/2022, he has normal CBC.  White count 6.9, hemoglobin 15.3 and platelet count of 230 On April 13, 2022, his hemoglobin was 11.8 Most recently, on July 13, 2022, his hemoglobin has dropped to 9.0 with MCV of 69 and low iron saturation At that point in time, the patient complained of significant shortness of breath on minimal exertion and some dizziness.  He had not noticed any recent bleeding such as epistaxis, hematuria or hematochezia The patient denies over the counter NSAID ingestion. He is on antiplatelets agents with aspirin and Brilinta due to revascularization procedure last year. Most recently, he underwent EGD and colonoscopy. According to the procedure report dated 12/18/2018, his EGD reviewed diffuse gastritis.  Colonoscopy revealed internal hemorrhoids. He had repeat procedure done on 07/21/2022; I do not have a copy of the procedure note yet. He has been taking oral iron supplement for a week or 2, complicated by severe gastritis and he had to stop. He is being referred here for intravenous iron infusion He had no prior history or diagnosis of cancer. His age appropriate screening programs are up-to-date. He denies any pica and eats a variety of diet. He never donated blood or received blood transfusion  MEDICAL HISTORY:  Past Medical History:  Diagnosis Date   Diabetes mellitus without complication ([place])    Type 2 since age 73 yrs   Hyperlipidemia    Hypertension     SURGICAL HISTORY: Past Surgical  History:  Procedure Laterality Date   CORONARY/GRAFT ACUTE MI REVASCULARIZATION N/A 01/01/2022   Procedure: Coronary/Graft Acute MI Revascularization;  Surgeon: Nigel Mormon, MD;  Location: [place] [facility];  Service: Cardiovascular;  Laterality: N/A;   LEFT HEART CATH AND CORONARY ANGIOGRAPHY  N/A 01/01/2022   Procedure: LEFT HEART CATH AND CORONARY ANGIOGRAPHY;  Surgeon: Nigel Mormon, MD;  Location: [place] [facility];  Service: Cardiovascular;  Laterality: N/A;    SOCIAL HISTORY: Social History   Socioeconomic History   Marital status: Married    Spouse name: Not on file   Number of children: 2   Years of education: Not on file   Highest education level: Not on file  Occupational History   Not on file  Tobacco Use   Smoking status: Never   Smokeless tobacco: Never  Vaping Use   Vaping Use: Never used  Substance and Sexual Activity   Alcohol use: Yes    Comment: occ   Drug use: Never   Sexual activity: Not on file  Other Topics Concern   Not on file  Social History Narrative   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   Social Determinants of Health   Financial Resource Strain: Not on file  Food Insecurity: Not on file  Transportation Needs: No Transportation Needs (01/03/2022)   PRAPARE - Hydrologist (Medical): No    Lack of Transportation (Non-Medical): No  Physical Activity: Not on file  Stress: Not on file  Social Connections: Not on file  Intimate Partner Violence: Not on file    FAMILY HISTORY: Family History  Problem Relation Age of Onset   Congestive Heart Failure Mother    Heart disease Father    Heart disease Sister    Heart disease Brother    Diabetes Sister     ALLERGIES:  is allergic to penicillins.  MEDICATIONS:  Current Outpatient Medications  Medication Sig Dispense Refill   amLODipine (NORVASC) 5 MG tablet Take 1 tablet (5 mg total) by mouth daily. 90 tablet 3   ascorbic acid (VITAMIN C) 500 MG tablet Take 500 mg by mouth daily.     BRILINTA 90 MG TABS tablet TAKE 1 TABLET BY MOUTH 2 TIMES DAILY. 60 tablet 1   Cholecalciferol (VITAMIN D3) 25 MCG (1000 UT) CAPS Take 1,000 Units by mouth daily.     dexlansoprazole (DEXILANT) 60 MG capsule Take 60 mg by mouth daily.     empagliflozin  (JARDIANCE) 25 MG TABS tablet Take 25 mg by mouth daily.     Evolocumab (REPATHA SURECLICK) [id number] MG/ML SOAJ Inject 140 mg into the skin every 14 (fourteen) days. 6 mL 5   insulin degludec (TRESIBA FLEXTOUCH) 100 UNIT/ML FlexTouch Pen Inject 30 Units into the skin at bedtime. 9 mL 1   metoprolol succinate (TOPROL-XL) 50 MG 24 hr tablet TAKE 1 TABLET BY MOUTH DAILY. TAKE WITH OR IMMEDIATELY FOLLOWING A MEAL. 90 tablet 3   nitroGLYCERIN (NITROSTAT) 0.4 MG SL tablet Place 1 tablet (0.4 mg total) under the tongue every 5 (five) minutes x 3 doses as needed for chest pain. 25 tablet 1   Semaglutide (OZEMPIC, 0.25 OR 0.5 MG/DOSE, [place]) Inject 0.5 mg into the skin every Sunday.     No current facility-administered medications for this visit.    REVIEW OF SYSTEMS:   Constitutional: Denies fevers, chills or abnormal night sweats Eyes: Denies blurriness of vision, double vision or watery eyes Ears, nose, mouth, throat, and face: Denies mucositis  or sore throat Cardiovascular: Denies palpitation, chest discomfort or lower extremity swelling Gastrointestinal:  Denies nausea, heartburn or change in bowel habits Skin: Denies abnormal skin rashes Lymphatics: Denies new lymphadenopathy or easy bruising Neurological:Denies numbness, tingling or new weaknesses Behavioral/Psych: Mood is stable, no new changes  All other systems were reviewed with the patient and are negative.  PHYSICAL EXAMINATION: ECOG PERFORMANCE STATUS: 1 - Symptomatic but completely ambulatory  Vitals:   07/28/22 1258  BP: (!) 132/47  Pulse: 80  Resp: 18  Temp: (!) 97.4 F (36.3 C)  SpO2: 100%   Filed Weights   07/28/22 1258  Weight: 120 lb 3.2 oz (54.5 kg)    GENERAL:alert, no distress and comfortable SKIN: skin color, texture, turgor are normal, no rashes or significant lesions EYES: normal, conjunctiva are pink and non-injected, sclera clear OROPHARYNX:no exudate, no erythema and lips, buccal mucosa, and tongue normal   NECK: supple, thyroid normal size, non-tender, without nodularity LYMPH:  no palpable lymphadenopathy in the cervical, axillary or inguinal LUNGS: clear to auscultation and percussion with normal breathing effort HEART: regular rate & rhythm and no murmurs and no lower extremity edema ABDOMEN:abdomen soft, non-tender and normal bowel sounds Musculoskeletal:no cyanosis of digits and no clubbing  PSYCH: alert & oriented x 3 with fluent speech NEURO: no focal motor/sensory deficits

## 2022-08-02 ENCOUNTER — Encounter: Payer: Self-pay | Admitting: Cardiology

## 2022-08-08 ENCOUNTER — Encounter: Payer: Self-pay | Admitting: Hematology and Oncology

## 2022-08-09 ENCOUNTER — Inpatient Hospital Stay: Payer: Medicare Other | Attending: Hematology and Oncology

## 2022-08-09 VITALS — BP 151/58 | HR 84 | Temp 98.7°F | Resp 17

## 2022-08-09 DIAGNOSIS — D509 Iron deficiency anemia, unspecified: Secondary | ICD-10-CM | POA: Diagnosis not present

## 2022-08-09 DIAGNOSIS — H2513 Age-related nuclear cataract, bilateral: Secondary | ICD-10-CM | POA: Diagnosis not present

## 2022-08-09 DIAGNOSIS — D508 Other iron deficiency anemias: Secondary | ICD-10-CM

## 2022-08-09 DIAGNOSIS — H02834 Dermatochalasis of left upper eyelid: Secondary | ICD-10-CM | POA: Diagnosis not present

## 2022-08-09 DIAGNOSIS — H02831 Dermatochalasis of right upper eyelid: Secondary | ICD-10-CM | POA: Diagnosis not present

## 2022-08-09 DIAGNOSIS — E113293 Type 2 diabetes mellitus with mild nonproliferative diabetic retinopathy without macular edema, bilateral: Secondary | ICD-10-CM | POA: Diagnosis not present

## 2022-08-09 MED ORDER — SODIUM CHLORIDE 0.9 % IV SOLN
510.0000 mg | Freq: Once | INTRAVENOUS | Status: AC
  Administered 2022-08-09: 510 mg via INTRAVENOUS
  Filled 2022-08-09: qty 510

## 2022-08-09 MED ORDER — SODIUM CHLORIDE 0.9 % IV SOLN: Freq: Once | INTRAVENOUS | Status: AC

## 2022-08-09 NOTE — Patient Instructions (Signed)

## 2022-08-09 NOTE — Progress Notes (Signed)
Patient remained for 30 minute post observation, VSS, discharged in stable condition.

## 2022-08-16 ENCOUNTER — Ambulatory Visit
Admission: RE | Admit: 2022-08-16 | Discharge: 2022-08-16 | Disposition: A | Discharge status: Home / Self Care | Payer: Medicare Other | Source: Ambulatory Visit | Attending: Internal Medicine | Admitting: Internal Medicine

## 2022-08-16 DIAGNOSIS — D631 Anemia in chronic kidney disease: Secondary | ICD-10-CM

## 2022-08-16 DIAGNOSIS — I10 Essential (primary) hypertension: Secondary | ICD-10-CM

## 2022-08-16 DIAGNOSIS — N1831 Chronic kidney disease, stage 3a: Secondary | ICD-10-CM

## 2022-08-16 DIAGNOSIS — N2581 Secondary hyperparathyroidism of renal origin: Secondary | ICD-10-CM

## 2022-08-16 DIAGNOSIS — N189 Chronic kidney disease, unspecified: Secondary | ICD-10-CM | POA: Diagnosis not present

## 2022-08-17 DIAGNOSIS — D500 Iron deficiency anemia secondary to blood loss (chronic): Secondary | ICD-10-CM | POA: Diagnosis not present

## 2022-08-17 DIAGNOSIS — E1122 Type 2 diabetes mellitus with diabetic chronic kidney disease: Secondary | ICD-10-CM | POA: Diagnosis not present

## 2022-08-23 ENCOUNTER — Inpatient Hospital Stay: Payer: Medicare Other

## 2022-08-23 VITALS — BP 155/67 | HR 83 | Temp 98.5°F | Resp 16

## 2022-08-23 DIAGNOSIS — D509 Iron deficiency anemia, unspecified: Secondary | ICD-10-CM | POA: Diagnosis not present

## 2022-08-23 DIAGNOSIS — D508 Other iron deficiency anemias: Secondary | ICD-10-CM

## 2022-08-23 MED ORDER — SODIUM CHLORIDE 0.9 % IV SOLN: Freq: Once | INTRAVENOUS | Status: AC

## 2022-08-23 MED ORDER — SODIUM CHLORIDE 0.9 % IV SOLN
510.0000 mg | Freq: Once | INTRAVENOUS | Status: AC
  Administered 2022-08-23: 510 mg via INTRAVENOUS
  Filled 2022-08-23: qty 510

## 2022-08-23 NOTE — Patient Instructions (Signed)

## 2022-08-23 NOTE — Progress Notes (Signed)
Patient tolerated his iron well- stayed for a few minutes post transfusion, but had done so well with previous iron infusion that he declined the 30 minute observation. Educated about risks- patient understood. VSS- BP (!) 155/67 (BP Location: Left Arm, Patient Position: Sitting)   Pulse 83   Temp 98.5 F (36.9 C) (Oral)   Resp 16   SpO2 100%   Patient ambulatory to the lobby with no issues.

## 2022-09-11 ENCOUNTER — Other Ambulatory Visit: Payer: Self-pay

## 2022-09-11 MED ORDER — TICAGRELOR 90 MG PO TABS: 90.0000 mg | ORAL_TABLET | Freq: Two times a day (BID) | ORAL | 1 refills | Status: DC

## 2022-09-14 DIAGNOSIS — E1122 Type 2 diabetes mellitus with diabetic chronic kidney disease: Secondary | ICD-10-CM | POA: Diagnosis not present

## 2022-09-14 DIAGNOSIS — D500 Iron deficiency anemia secondary to blood loss (chronic): Secondary | ICD-10-CM | POA: Diagnosis not present

## 2022-10-11 ENCOUNTER — Encounter: Payer: Self-pay | Admitting: Cardiology

## 2022-10-11 ENCOUNTER — Ambulatory Visit: Payer: Medicare Other | Admitting: Cardiology

## 2022-10-11 VITALS — BP 157/70 | HR 70 | Resp 17 | Ht 63.0 in | Wt 120.0 lb

## 2022-10-11 DIAGNOSIS — I2510 Atherosclerotic heart disease of native coronary artery without angina pectoris: Secondary | ICD-10-CM | POA: Diagnosis not present

## 2022-10-11 DIAGNOSIS — D500 Iron deficiency anemia secondary to blood loss (chronic): Secondary | ICD-10-CM | POA: Diagnosis not present

## 2022-10-11 DIAGNOSIS — E782 Mixed hyperlipidemia: Secondary | ICD-10-CM | POA: Diagnosis not present

## 2022-10-11 DIAGNOSIS — I10 Essential (primary) hypertension: Secondary | ICD-10-CM | POA: Diagnosis not present

## 2022-10-11 NOTE — Progress Notes (Signed)
Follow up visit  Subjective:   Nicolas Krause, male    DOB: 20-Apr-1951, 72 y.o.   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  He underwent EGD/colonoscopy, fortunately was not found to have any ulcers.  Since then, he has been off aspirin, but back on Brilinta.  He has not had any further bleeding issues.  Hemoglobin is now up to 11.  Blood pressure elevated today, but much lower at home.  He has upcoming appointment with his nephrologist Dr. Zetta Bills.  Discharge summary 01/02/2022: Culprit lesion in mid LAD was successfully treated with primary PCI, details below.  Patient has diffuse and at least moderate residual disease in ostial to proximal LAD.  Interventional management was deferred to avoid contrast-induced nephropathy in the setting of underlying CKD.  Echocardiogram showed severe LAD territory hypokinesis with EF of 20-25%.  BNP was elevated at 700.  However, patient was clinically very well compensated and ambulated without any chest pain or shortness of breath.  Further inpatient management was discussed, but patient was very keen on discharge home, with close follow-up.  Given that he is fairly asymptomatic, and is very reliable for follow-up, this is reasonable.  I resumed his ARB on discharge, hydrocele losartan 12.5 mg daily during the hospitalization.  I added p.o. 40 mg Lasix daily on discharge, and resume home Jardiance.  Given low normal blood pressure, and low EF, I held beta-blocker, and also held spironolactone initiation given increasing  creatinine and potassium.  I will resume this during close outpatient follow-up, scheduled on 01/05/2022.  Patient will obtain BMP on 01/04/2022.  He was discharged on dual antiplatelet therapy aspirin, Brilinta, Crestor 10 mg daily.  He is also participating in clinical trial eval-MI for early initiation of Repatha.  Lipoprotein a is pending. Given relatively low LDL, suspicion for elevated lipoprotein (a), I chose to use Crestor 10, rather than 20 mg daily. I will add beta blocker outpatient after further diuresis. Finally, LifeVest was recommended given anterior MI and EF 20-25%.  Once approved, this will be delivered to his home, hopefully later today.  Patient is here with his son Sharlet Salina today. He has not had any chest pain, shortness of breath since discharge. Today, he was able to climb a flight of 20 stairs without any difficulty or symptoms. Reviewed BMP labs from 8/30 with the patient, details below.     Current Outpatient Medications:    amLODipine (NORVASC) 5 MG tablet, Take 1 tablet (5 mg total) by mouth daily., Disp: 90 tablet, Rfl: 3   ascorbic acid (VITAMIN C) 500 MG tablet, Take 500 mg by mouth daily., Disp: , Rfl:    Cholecalciferol (VITAMIN D3) 25 MCG (1000 UT) CAPS, Take 1,000 Units by mouth daily., Disp: , Rfl:    dexlansoprazole (DEXILANT) 60 MG capsule, Take 60 mg by mouth daily., Disp: , Rfl:    empagliflozin (JARDIANCE) 25 MG TABS tablet, Take 25 mg by mouth daily., Disp: , Rfl:    Evolocumab (REPATHA SURECLICK) 140 MG/ML SOAJ, Inject  140 mg into the skin every 14 (fourteen) days., Disp: 6 mL, Rfl: 5   insulin degludec (TRESIBA FLEXTOUCH) 100 UNIT/ML FlexTouch Pen, Inject 30 Units into the skin at bedtime., Disp: 9 mL, Rfl: 1   metoprolol succinate (TOPROL-XL) 50 MG 24 hr tablet, TAKE 1 TABLET BY MOUTH DAILY. TAKE WITH OR IMMEDIATELY FOLLOWING A MEAL., Disp: 90 tablet, Rfl: 3   nitroGLYCERIN (NITROSTAT) 0.4 MG SL tablet, Place 1 tablet (0.4 mg total) under the tongue every 5  (five) minutes x 3 doses as needed for chest pain., Disp: 25 tablet, Rfl: 1   Semaglutide (OZEMPIC, 0.25 OR 0.5 MG/DOSE, [place]), Inject 0.5 mg into the skin every Sunday., Disp: , Rfl:    ticagrelor (BRILINTA) 90 MG TABS tablet, Take 1 tablet (90 mg total) by mouth 2 (two) times daily., Disp: 60 tablet, Rfl: 1   Cardiovascular & other pertient studies:  Reviewed external labs and tests, independently interpreted  EKG 07/12/2022: Sinus rhythm Old anteroseptal infarct  Exercise nuclear stress test 03/28/2022: Myocardial perfusion is normal. Overall LV systolic function is normal without regional wall motion abnormalities. Stress LV EF: 62%.  Normal ECG stress. The patient exercised for 6 minutes and 16 seconds of a Bruce protocol, achieving approximately 7.44 METs & 106% MPHR. Stress terminated due to THR achieved and fatigue. The blood pressure response was normal. No previous exam available for comparison. Low risk.    Echocardiogram 02/21/2022: 1. Left ventricular ejection fraction, by estimation, is 50 to 55%. The  left ventricle has normal function. The left ventricle has no regional  wall motion abnormalities. Left ventricular diastolic parameters are  consistent with Grade I diastolic  dysfunction (impaired relaxation).   2. Right ventricular systolic function is normal. The right ventricular  size is normal. There is normal pulmonary artery systolic pressure.   3. The mitral valve is normal in structure. Mild mitral valve  regurgitation. No evidence of mitral stenosis.   4. The aortic valve is normal in structure. Aortic valve regurgitation is  not visualized. No aortic stenosis is present.   Comparison(s): A prior study was performed on 01/01/2022. Changes from  prior study are noted. Wl motion abnormalities have resolved. LVEF has  increased from 20-25%.    Coronary intervention 01/01/2022: LM: Diffuse 40% disease, MLA 7.3 mm2 LAD: Diffuse prox 50% disease, MLA 4.0 mm2           Mid 95% stenosis (Culprit lesion), followed by 40% disease Lcx: Large, dominant        Prox 20%, OM2 ostial 20%, OM# ostial 30% disease RCA: Small, non-dominant. No significant disease   LVEDP 24 mmHg   Successful percutaneous coronary intervention mid LAD     IVUS guided PTCA and stent placement Synergy 2.5 X 24 mm Synergy drug-eluting stent     0% culprit stenosis     TIMI flow I-->III Will consider re-look angiography or stress testing in a few days to address prox LAD disease.  Recent labs: 09/14/2022: Glucose 112, BUN/Cr 22/1.53. EGFR 47. Na/K 139/4.6. Rest of the CMP normal H/H 11/33. MCV 83. Platelets 295  06/09/2022: Glucose 147, BUN/Cr 26/1.5. EGFR 48. Na/K 139/4.7. Rest of the CMP normal Chol 96, TG 94, HDL 49, LDL 28  01/02/2022: Glucose 152, BUN/Cr 37/2.0. EGFR 35. Na/K 140/5.1. Rest of the CMP normal H/H 15/41. MCV 87. Platelets 230 HbA1C 5.0% Chol 136, TG 166, HDL 45, LDL 58 Lipoprotein (a) 87 BNP 763 Trop HS 11,490    Review of Systems  Constitutional:  Negative for malaise/fatigue.  Cardiovascular:  Negative for chest pain, dyspnea on exertion, leg swelling, palpitations and syncope.         Vitals:   10/11/22 1523  BP: (!) 157/70  Pulse: 70  Resp: 17  SpO2: 100%    Body mass index is 21.26 kg/m. Filed Weights   10/11/22 1523  Weight: 120 lb (54.4 kg)    Objective:   Physical Exam Vitals and nursing note reviewed.  Constitutional:      General: He is not in acute distress. Neck:     Vascular: No JVD.  Cardiovascular:     Rate and Rhythm: Normal rate and regular rhythm.     Heart sounds: Normal heart sounds. No murmur heard. Pulmonary:     Effort: Pulmonary effort is normal.     Breath sounds: Normal breath sounds. No wheezing or rales.  Musculoskeletal:     Right lower leg: No edema.     Left lower leg: No edema.          Visit diagnoses: No diagnosis found.     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 Unable to use most GDMT due to renal dysfunction. Raynelle Chary currently on hold due to fluctuating Cr 1.6-1.9, K 5.2-5.7. Continue metoprolol succinate 50 mg daily. Continue Jardiance-on 25 mg daily, as long as GFR>20.  CAD: STEMI 8/203, treated with mid LAD PCI. Residual diffuse at least moderate disease in prox LAD. No ischemia on stress testing (03/2022). Continue medical management at this time. Off aspirin due to GI bleeding, currently on Brilinta. Starting 01/07/2023, I would recommend stopping Brilinta and starting monotherapy with Plavix 75 mg daily. Continue Repatha, statin now discontinued. It is beneficial for him to be on Repatha alone given LDL is well controlled on it, and statin can in fact increase lipoprotein (a), which was mildly elevated in him.   CKD: Cr 1.53.  Continue follow-up with Dr. Allena Katz.    Hypertension: No change made today. I do think ARB can be reintroduced at some point.  Increase physical exercise. Will defer to Dr. Zetta Bills.  F/u in 6 months     Elder Negus, MD Pager: [phone] Office: [phone]

## 2022-10-16 ENCOUNTER — Other Ambulatory Visit: Payer: Self-pay

## 2022-10-16 MED ORDER — TICAGRELOR 90 MG PO TABS: 90.0000 mg | ORAL_TABLET | Freq: Two times a day (BID) | ORAL | 3 refills | Status: DC

## 2022-10-19 DIAGNOSIS — E7800 Pure hypercholesterolemia, unspecified: Secondary | ICD-10-CM | POA: Diagnosis not present

## 2022-10-19 DIAGNOSIS — E119 Type 2 diabetes mellitus without complications: Secondary | ICD-10-CM | POA: Diagnosis not present

## 2022-10-19 DIAGNOSIS — N1831 Chronic kidney disease, stage 3a: Secondary | ICD-10-CM | POA: Diagnosis not present

## 2022-10-19 DIAGNOSIS — I2510 Atherosclerotic heart disease of native coronary artery without angina pectoris: Secondary | ICD-10-CM | POA: Diagnosis not present

## 2022-10-19 DIAGNOSIS — I10 Essential (primary) hypertension: Secondary | ICD-10-CM | POA: Diagnosis not present

## 2022-10-19 DIAGNOSIS — E611 Iron deficiency: Secondary | ICD-10-CM | POA: Diagnosis not present

## 2022-10-25 DIAGNOSIS — N1831 Chronic kidney disease, stage 3a: Secondary | ICD-10-CM | POA: Diagnosis not present

## 2022-10-25 DIAGNOSIS — I129 Hypertensive chronic kidney disease with stage 1 through stage 4 chronic kidney disease, or unspecified chronic kidney disease: Secondary | ICD-10-CM | POA: Diagnosis not present

## 2022-10-25 DIAGNOSIS — N2581 Secondary hyperparathyroidism of renal origin: Secondary | ICD-10-CM | POA: Diagnosis not present

## 2022-10-25 DIAGNOSIS — D631 Anemia in chronic kidney disease: Secondary | ICD-10-CM | POA: Diagnosis not present

## 2022-11-20 DIAGNOSIS — D649 Anemia, unspecified: Secondary | ICD-10-CM | POA: Diagnosis not present

## 2022-11-20 DIAGNOSIS — N183 Chronic kidney disease, stage 3 unspecified: Secondary | ICD-10-CM | POA: Diagnosis not present

## 2022-11-20 DIAGNOSIS — I2510 Atherosclerotic heart disease of native coronary artery without angina pectoris: Secondary | ICD-10-CM | POA: Diagnosis not present

## 2022-11-20 DIAGNOSIS — E1122 Type 2 diabetes mellitus with diabetic chronic kidney disease: Secondary | ICD-10-CM | POA: Diagnosis not present

## 2022-12-15 ENCOUNTER — Other Ambulatory Visit: Payer: Self-pay | Admitting: Cardiology

## 2022-12-15 MED ORDER — CLOPIDOGREL BISULFATE 75 MG PO TABS
75.0000 mg | ORAL_TABLET | Freq: Every day | ORAL | 3 refills | Status: DC
Start: 2023-01-07 — End: 2023-04-11

## 2022-12-18 ENCOUNTER — Other Ambulatory Visit (HOSPITAL_COMMUNITY): Payer: Self-pay

## 2023-01-09 DIAGNOSIS — I2510 Atherosclerotic heart disease of native coronary artery without angina pectoris: Secondary | ICD-10-CM | POA: Diagnosis not present

## 2023-01-09 DIAGNOSIS — E119 Type 2 diabetes mellitus without complications: Secondary | ICD-10-CM | POA: Diagnosis not present

## 2023-01-09 DIAGNOSIS — N183 Chronic kidney disease, stage 3 unspecified: Secondary | ICD-10-CM | POA: Diagnosis not present

## 2023-01-09 DIAGNOSIS — E611 Iron deficiency: Secondary | ICD-10-CM | POA: Diagnosis not present

## 2023-01-19 DIAGNOSIS — Z23 Encounter for immunization: Secondary | ICD-10-CM | POA: Diagnosis not present

## 2023-02-14 ENCOUNTER — Other Ambulatory Visit: Payer: Self-pay | Admitting: Cardiology

## 2023-02-14 DIAGNOSIS — I2510 Atherosclerotic heart disease of native coronary artery without angina pectoris: Secondary | ICD-10-CM

## 2023-02-14 DIAGNOSIS — I252 Old myocardial infarction: Secondary | ICD-10-CM

## 2023-02-14 MED ORDER — REPATHA SURECLICK 140 MG/ML ~~LOC~~ SOAJ
140.0000 mg | SUBCUTANEOUS | 5 refills | Status: DC
Start: 2023-02-14 — End: 2023-02-20

## 2023-02-15 DIAGNOSIS — D500 Iron deficiency anemia secondary to blood loss (chronic): Secondary | ICD-10-CM | POA: Diagnosis not present

## 2023-02-15 DIAGNOSIS — N183 Chronic kidney disease, stage 3 unspecified: Secondary | ICD-10-CM | POA: Diagnosis not present

## 2023-02-15 DIAGNOSIS — I2510 Atherosclerotic heart disease of native coronary artery without angina pectoris: Secondary | ICD-10-CM | POA: Diagnosis not present

## 2023-02-15 DIAGNOSIS — E119 Type 2 diabetes mellitus without complications: Secondary | ICD-10-CM | POA: Diagnosis not present

## 2023-02-15 DIAGNOSIS — E7800 Pure hypercholesterolemia, unspecified: Secondary | ICD-10-CM | POA: Diagnosis not present

## 2023-02-15 DIAGNOSIS — N1831 Chronic kidney disease, stage 3a: Secondary | ICD-10-CM | POA: Diagnosis not present

## 2023-02-20 ENCOUNTER — Encounter: Payer: Self-pay | Admitting: Hematology and Oncology

## 2023-02-20 ENCOUNTER — Telehealth: Payer: Self-pay | Admitting: Pharmacy Technician

## 2023-02-20 ENCOUNTER — Other Ambulatory Visit (HOSPITAL_COMMUNITY): Payer: Self-pay

## 2023-02-20 ENCOUNTER — Telehealth: Payer: Self-pay

## 2023-02-20 DIAGNOSIS — I252 Old myocardial infarction: Secondary | ICD-10-CM

## 2023-02-20 DIAGNOSIS — I2510 Atherosclerotic heart disease of native coronary artery without angina pectoris: Secondary | ICD-10-CM

## 2023-02-20 MED ORDER — PRALUENT 150 MG/ML ~~LOC~~ SOAJ: 150.0000 mg | SUBCUTANEOUS | 5 refills | Status: DC

## 2023-02-20 NOTE — Addendum Note (Signed)
Addended by: Virl Axe, Kishawn Pickar L on: 02/20/2023 03:49 PM   Modules accepted: Orders

## 2023-02-20 NOTE — Telephone Encounter (Signed)
Pharmacy Patient Advocate Encounter  Received notification from [facility] that Prior Authorization for repatha has been DENIED.  See denial reason below. No denial letter attached in CMM. Will attache denial letter to Media tab once received.    PA #/Case ID/Reference #: [id number]

## 2023-02-20 NOTE — Telephone Encounter (Signed)
Discussed with the patient. He is okay with Praluent. We can send a prescription.   Thanks MJP

## 2023-02-20 NOTE — Telephone Encounter (Signed)
Pharmacy Patient Advocate Encounter   Received notification from Pt Calls Messages that prior authorization for repatha is required/requested.   Insurance verification completed.   The patient is insured through [facility] .   Per test claim: PA required; PA submitted to [facility] via CoverMyMeds Key/confirmation #/EOC [id number] Status is pending

## 2023-02-20 NOTE — Telephone Encounter (Signed)
Placed order for Praluent. Will send message to see if prior auth can be done on this medication.

## 2023-02-20 NOTE — Telephone Encounter (Signed)
Open encounter to check if Repatha is on patient's list. Repatha was ordered on 02/14/23 by Dr. Rosemary Holms. Will see if patient can get pre auth for medication. Will send to pharmacy.

## 2023-02-20 NOTE — Telephone Encounter (Signed)
Pharmacy Patient Advocate Encounter   Received notification from Pt Calls Messages that prior authorization for praluent is required/requested.   Insurance verification completed.   The patient is insured through [facility] .   Per test claim: PA required; PA submitted to [facility] via CoverMyMeds Key/confirmation #/EOC [id number] Status is pending

## 2023-02-21 ENCOUNTER — Other Ambulatory Visit (HOSPITAL_COMMUNITY): Payer: Self-pay

## 2023-02-21 DIAGNOSIS — E113293 Type 2 diabetes mellitus with mild nonproliferative diabetic retinopathy without macular edema, bilateral: Secondary | ICD-10-CM | POA: Diagnosis not present

## 2023-02-21 DIAGNOSIS — H25813 Combined forms of age-related cataract, bilateral: Secondary | ICD-10-CM | POA: Diagnosis not present

## 2023-02-21 DIAGNOSIS — H5203 Hypermetropia, bilateral: Secondary | ICD-10-CM | POA: Diagnosis not present

## 2023-02-21 NOTE — Telephone Encounter (Signed)
Thank you very much.  MJP

## 2023-02-21 NOTE — Telephone Encounter (Signed)
Pharmacy Patient Advocate Encounter  Received notification from [facility] that Prior Authorization for praluent has been APPROVED from 02/21/23 to 05/07/2098. Ran test claim, Copay is $111.98- one month. This test claim was processed through [facility]- copay amounts may vary at other pharmacies due to pharmacy/plan contracts, or as the patient moves through the different stages of their insurance plan.   PA #/Case ID/Reference #: [id number]

## 2023-03-27 ENCOUNTER — Other Ambulatory Visit: Payer: Self-pay | Admitting: Medical Genetics

## 2023-03-27 DIAGNOSIS — Z006 Encounter for examination for normal comparison and control in clinical research program: Secondary | ICD-10-CM

## 2023-03-28 ENCOUNTER — Other Ambulatory Visit: Payer: Self-pay | Admitting: *Deleted

## 2023-03-28 DIAGNOSIS — I10 Essential (primary) hypertension: Secondary | ICD-10-CM

## 2023-03-28 MED ORDER — AMLODIPINE BESYLATE 5 MG PO TABS
5.0000 mg | ORAL_TABLET | Freq: Every day | ORAL | 3 refills | Status: DC
Start: 2023-03-28 — End: 2023-04-11

## 2023-03-28 NOTE — Progress Notes (Signed)
Secure chat sent by Dr. Rosemary Holms to refill this pts amlodipine 5 mg po daily give 90 tablets with 3 refills.  Refill request sent to the pts pharmacy of file.

## 2023-03-30 DIAGNOSIS — I2510 Atherosclerotic heart disease of native coronary artery without angina pectoris: Secondary | ICD-10-CM | POA: Diagnosis not present

## 2023-03-30 DIAGNOSIS — E1122 Type 2 diabetes mellitus with diabetic chronic kidney disease: Secondary | ICD-10-CM | POA: Diagnosis not present

## 2023-03-30 DIAGNOSIS — D500 Iron deficiency anemia secondary to blood loss (chronic): Secondary | ICD-10-CM | POA: Diagnosis not present

## 2023-03-31 LAB — LAB REPORT - SCANNED
A1c: 5.6
EGFR: 50

## 2023-04-11 ENCOUNTER — Encounter: Payer: Self-pay | Admitting: Cardiology

## 2023-04-11 ENCOUNTER — Ambulatory Visit: Payer: Medicare Other | Attending: Cardiology | Admitting: Cardiology

## 2023-04-11 VITALS — BP 120/68 | HR 83 | Resp 16 | Ht 63.0 in | Wt 116.6 lb

## 2023-04-11 DIAGNOSIS — I2510 Atherosclerotic heart disease of native coronary artery without angina pectoris: Secondary | ICD-10-CM | POA: Insufficient documentation

## 2023-04-11 DIAGNOSIS — I10 Essential (primary) hypertension: Secondary | ICD-10-CM | POA: Insufficient documentation

## 2023-04-11 MED ORDER — CLOPIDOGREL BISULFATE 75 MG PO TABS: 75.0000 mg | ORAL_TABLET | Freq: Every day | ORAL | 3 refills | Status: DC

## 2023-04-11 MED ORDER — METOPROLOL SUCCINATE ER 50 MG PO TB24: 50.0000 mg | ORAL_TABLET | Freq: Every day | ORAL | 3 refills | Status: DC

## 2023-04-11 MED ORDER — AMLODIPINE BESYLATE 5 MG PO TABS: 5.0000 mg | ORAL_TABLET | Freq: Every day | ORAL | 3 refills | Status: DC

## 2023-04-11 NOTE — Patient Instructions (Signed)
 Medication Instructions:   Your physician recommends that you continue on your current medications as directed. Please refer to the Current Medication list given to you today.  *If you need a refill on your cardiac medications before your next appointment, please call your pharmacy*   Follow-Up: At [facility], you and your health needs are our priority.  As part of our continuing mission to provide you with exceptional heart care, we have created designated Provider Care Teams.  These Care Teams include your primary Cardiologist (physician) and Advanced Practice Providers (APPs -  Physician Assistants and Nurse Practitioners) who all work together to provide you with the care you need, when you need it.  We recommend signing up for the patient portal called "MyChart".  Sign up information is provided on this After Visit Summary.  MyChart is used to connect with patients for Virtual Visits (Telemedicine).  Patients are able to view lab/test results, encounter notes, upcoming appointments, etc.  Non-urgent messages can be sent to your provider as well.   To learn more about what you can do with MyChart, go to [email or web address].    Your next appointment:   1 year(s)  Provider:   DR. Rosemary Holms

## 2023-04-11 NOTE — Progress Notes (Signed)
Cardiology Office Note:  .   Date:  04/11/2023  ID:  Nicolas Krause, DOB 1951/04/22, MRN [id number] PCP: Patient, No Pcp Per  [facility] Providers Cardiologist:  Truett Mainland, MD PCP: Patient, No Pcp Per  Chief Complaint  Patient presents with   Coronary artery disease involving native coronary artery of   Follow-up      History of Present Illness: .    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 He underwent EGD/colonoscopy, fortunately was not found to have any ulcers. Since then, he has been off aspirin, but back on Brilinta. In 01/2023, we stopped Brilinta and started Plavix monotherapy (1 year since MI). He has not had any further bleeding issues. Hemoglobin is now up to 12, especially with addition of iron supplements.  Patient is tolerating Plavix well.  Patient is staying active, denies chest pain, shortness of breath, palpitations, leg edema, orthopnea, PND, TIA/syncope.  Reviewed recent lipid panel results with the patient, details below.  Patient recently had a visit to [place] where he may have had unusual fried food.  There was also some delay in getting his PCSK9 elevators during our practice transition.  Blood pressure is well-controlled.     Vitals:   04/11/23 1515  BP: 120/68  Pulse: 83  Resp: 16  SpO2: 98%     ROS:  Review of Systems  Cardiovascular:  Negative for chest pain, dyspnea on exertion, leg swelling, palpitations and syncope.     Studies Reviewed: Marland Kitchen        Independently interpreted 03/30/2023: Chol 152, TG 165, HDL 45, LDL 79 HbA1C 5.6% Hb 12.6 Cr 1.46, eGFR 50, K 5.0  Independently interpreted 02/2023: Chol 118, TG 85, HDL  49, LDL 58 HbA1C 5.5% Hb 9.9 Cr 1.4    Physical Exam:   Physical Exam Vitals and nursing note reviewed.  Constitutional:      General: He is not in acute distress. Neck:     Vascular: No JVD.  Cardiovascular:     Rate and Rhythm: Normal rate and regular rhythm.     Heart sounds: Normal heart sounds. No murmur heard. Pulmonary:     Effort: Pulmonary effort is normal.     Breath sounds: Normal breath sounds. No wheezing or rales.  Musculoskeletal:     Right lower leg: No edema.     Left lower leg: No edema.      VISIT DIAGNOSES:   ICD-10-CM   1. Coronary artery disease involving native coronary artery of native heart without angina pectoris  I25.10     2. Essential hypertension  I10 amLODipine (NORVASC) 5 MG tablet       ASSESSMENT AND PLAN: .    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 Residual diffuse at least moderate disease in prox LAD. No ischemia on stress testing (03/2022). Continue medical management at this time. Off aspirin due to GI bleeding, currently on Plavix 75 mg daily. Continue  Praluent.  Repeat lipid panel in 6 months.     CKD: Cr 1.46.  Continue follow-up with Dr. Allena Katz.   Tolerating ramipril 2.5 mg daily.    Hypertension: Well-controlled.      Meds ordered this encounter  Medications   metoprolol succinate (TOPROL-XL) 50 MG 24 hr tablet    Sig: Take 1 tablet (50 mg total) by mouth daily. TAKE WITH OR IMMEDIATELY FOLLOWING A MEAL.    Dispense:  90 tablet    Refill:  3   clopidogrel (PLAVIX) 75 MG tablet    Sig: Take 1 tablet (75 mg total) by mouth daily.    Dispense:  90 tablet    Refill:  3   amLODipine (NORVASC) 5 MG tablet    Sig: Take 1 tablet (5 mg total) by mouth daily.     Dispense:  90 tablet    Refill:  3     F/u in 1 year  Signed, Elder Negus, MD

## 2023-04-12 ENCOUNTER — Telehealth: Payer: Self-pay | Admitting: Pharmacist

## 2023-04-12 MED ORDER — PRALUENT 75 MG/ML ~~LOC~~ SOAJ: 75.0000 mg | SUBCUTANEOUS | 3 refills | Status: DC

## 2023-04-12 NOTE — Telephone Encounter (Signed)
-----   Message from Nurse Corky Crafts sent at 04/11/2023  3:46 PM EST ----- Regarding: refill praluent Dr. Rosemary Holms saw this pt in clinic and he needs his praluent refilled but at 75 mg/ML  Can you please assist with this?   Thanks [employer]

## 2023-04-23 DIAGNOSIS — N1831 Chronic kidney disease, stage 3a: Secondary | ICD-10-CM | POA: Diagnosis not present

## 2023-04-23 DIAGNOSIS — I129 Hypertensive chronic kidney disease with stage 1 through stage 4 chronic kidney disease, or unspecified chronic kidney disease: Secondary | ICD-10-CM | POA: Diagnosis not present

## 2023-04-23 DIAGNOSIS — N2581 Secondary hyperparathyroidism of renal origin: Secondary | ICD-10-CM | POA: Diagnosis not present

## 2023-04-23 DIAGNOSIS — I10 Essential (primary) hypertension: Secondary | ICD-10-CM | POA: Diagnosis not present

## 2023-04-23 DIAGNOSIS — D631 Anemia in chronic kidney disease: Secondary | ICD-10-CM | POA: Diagnosis not present

## 2023-04-23 DIAGNOSIS — N189 Chronic kidney disease, unspecified: Secondary | ICD-10-CM | POA: Diagnosis not present

## 2023-04-25 ENCOUNTER — Other Ambulatory Visit (HOSPITAL_COMMUNITY)
Admission: RE | Admit: 2023-04-25 | Discharge: 2023-04-25 | Disposition: A | Discharge status: Home / Self Care | Payer: Self-pay | Source: Ambulatory Visit | Attending: Oncology | Admitting: Oncology

## 2023-04-25 DIAGNOSIS — Z006 Encounter for examination for normal comparison and control in clinical research program: Secondary | ICD-10-CM

## 2023-05-07 LAB — GENECONNECT MOLECULAR SCREEN: Genetic Analysis Overall Interpretation: NEGATIVE

## 2023-05-18 ENCOUNTER — Telehealth: Payer: Self-pay | Admitting: Pharmacist

## 2023-05-18 MED ORDER — PRALUENT 150 MG/ML ~~LOC~~ SOAJ: 150.0000 mg | SUBCUTANEOUS | 3 refills | Status: AC

## 2023-05-18 NOTE — Telephone Encounter (Signed)
-----   Message from Nurse Porter HERO sent at 05/18/2023 12:58 PM EST ----- Regarding: needs praluent  refilled for 150 per [place] Dr. [place] wants this pt to get a refill of his praluent  but not the 75 mg/ml dose but the 150 mg/ml dose.  Can you please assist with this?   Thanks friends, Porter

## 2023-07-03 DIAGNOSIS — E7800 Pure hypercholesterolemia, unspecified: Secondary | ICD-10-CM | POA: Diagnosis not present

## 2023-07-03 DIAGNOSIS — E1122 Type 2 diabetes mellitus with diabetic chronic kidney disease: Secondary | ICD-10-CM | POA: Diagnosis not present

## 2023-07-03 DIAGNOSIS — I2510 Atherosclerotic heart disease of native coronary artery without angina pectoris: Secondary | ICD-10-CM | POA: Diagnosis not present

## 2023-07-03 DIAGNOSIS — E611 Iron deficiency: Secondary | ICD-10-CM | POA: Diagnosis not present

## 2023-07-03 DIAGNOSIS — N1831 Chronic kidney disease, stage 3a: Secondary | ICD-10-CM | POA: Diagnosis not present

## 2023-07-04 LAB — LAB REPORT - SCANNED
A1c: 6.1
EGFR: 43

## 2023-07-10 ENCOUNTER — Non-Acute Institutional Stay (HOSPITAL_COMMUNITY)
Admission: RE | Admit: 2023-07-10 | Discharge: 2023-07-10 | Disposition: A | Discharge status: Home / Self Care | Payer: Medicare Other | Source: Ambulatory Visit | Attending: Internal Medicine | Admitting: Internal Medicine

## 2023-07-10 DIAGNOSIS — D509 Iron deficiency anemia, unspecified: Secondary | ICD-10-CM | POA: Diagnosis not present

## 2023-07-10 MED ORDER — SODIUM CHLORIDE 0.9 % IV SOLN
510.0000 mg | Freq: Once | INTRAVENOUS | Status: AC
  Administered 2023-07-10: 510 mg via INTRAVENOUS
  Filled 2023-07-10: qty 510

## 2023-07-10 MED ORDER — SODIUM CHLORIDE 0.9 % IV SOLN: INTRAVENOUS | Status: DC | PRN

## 2023-07-10 NOTE — Progress Notes (Signed)
 PATIENT CARE CENTER NOTE   Diagnosis: D50.9   Provider: Charna Elizabeth, MD   Procedure: Feraheme infusion    Note: Patient received Feraheme 510 mg infusion (dose # 1 of 2) via PIV. Patient tolerated infusion well with with no adverse reaction. Observed patient for 30 minutes post infusion. Patient's BP elevated pre (166/73) and post (157/61) infusion but patient otherwise stable. AVS reviewed with patient. Patient to come back in 1 week for second infusion and will schedule next appointment at the front desk. Patient alert, oriented and ambulatory at discharge.

## 2023-07-12 DIAGNOSIS — E611 Iron deficiency: Secondary | ICD-10-CM | POA: Diagnosis not present

## 2023-07-12 DIAGNOSIS — E1122 Type 2 diabetes mellitus with diabetic chronic kidney disease: Secondary | ICD-10-CM | POA: Diagnosis not present

## 2023-07-12 DIAGNOSIS — N1831 Chronic kidney disease, stage 3a: Secondary | ICD-10-CM | POA: Diagnosis not present

## 2023-07-18 ENCOUNTER — Ambulatory Visit (HOSPITAL_COMMUNITY)
Admission: RE | Admit: 2023-07-18 | Discharge: 2023-07-18 | Disposition: A | Discharge status: Home / Self Care | Source: Ambulatory Visit | Attending: Internal Medicine | Admitting: Internal Medicine

## 2023-07-18 DIAGNOSIS — N189 Chronic kidney disease, unspecified: Secondary | ICD-10-CM | POA: Insufficient documentation

## 2023-07-18 DIAGNOSIS — D631 Anemia in chronic kidney disease: Secondary | ICD-10-CM | POA: Insufficient documentation

## 2023-07-18 DIAGNOSIS — D509 Iron deficiency anemia, unspecified: Secondary | ICD-10-CM | POA: Insufficient documentation

## 2023-07-18 MED ORDER — SODIUM CHLORIDE 0.9 % IV SOLN
510.0000 mg | Freq: Once | INTRAVENOUS | Status: AC
  Administered 2023-07-18: 510 mg via INTRAVENOUS
  Filled 2023-07-18: qty 17

## 2023-07-18 MED ORDER — SODIUM CHLORIDE 0.9 % IV SOLN: INTRAVENOUS | Status: DC | PRN

## 2023-07-18 NOTE — Progress Notes (Signed)
 PATIENT CARE CENTER NOTE  Diagnosis: D50.9   Provider: Charna Elizabeth MD  Procedure: Feraheme 510 mg   Note: Patient received Feraheme 510 mg infusion (dose #2 of 2) via PIV. Pt tolerated infusion well with no adverse reaction. Observed pt  for 30 minutes post infusion. Pts BP elevated pre- infusion (155/76), but otherwise pt stable. Pts BP at end of infusion is 140/64. AVS offered, but pt declined. Pt is alert, oriented, and ambulatory at discharge.

## 2023-07-20 ENCOUNTER — Telehealth: Payer: Self-pay | Admitting: *Deleted

## 2023-07-20 NOTE — Telephone Encounter (Signed)
   Pre-operative Risk Assessment    Patient Name: Nicolas Krause  DOB: 1950/05/27 MRN: [id number]   Date of last office visit: 04/11/23 DR. PATWARDHAN Date of next office visit: NONE   Request for Surgical Clearance    Procedure:   EGD  Date of Surgery:  Clearance 08/01/23                                Surgeon:  DR. HUNG Surgeon's Group or Practice Name:  [facility] Phone number:  [phone] Fax number:  [phone]   Type of Clearance Requested:   - Medical  - Pharmacy:  Hold Ticagrelor (Brilinta)     Type of Anesthesia:   PROPOFOL   Additional requests/questions:    Elpidio Anis   07/20/2023, 2:45 PM

## 2023-07-20 NOTE — Telephone Encounter (Signed)
 Left message to call back to schedule tele pre op appt.

## 2023-07-20 NOTE — Telephone Encounter (Signed)
   Name: Nicolas Krause  DOB: 09/03/1950  MRN: [id number]  Primary Cardiologist: None   Preoperative team, please contact this patient and set up a phone call appointment for further preoperative risk assessment. Please obtain consent and complete medication review. Thank you for your help.  I confirm that guidance regarding antiplatelet and oral anticoagulation therapy has been completed and, if necessary, noted below.  Pt no longer takes Brilinta. Per office protocol, if patient is without any new symptoms or concerns at the time of their virtual visit, he may hold Plavix for 5 days prior to procedure. Please resume Plavix as soon as possible postprocedure, at the discretion of the surgeon. We recommend patient take Aspirin 81 mg daily throughout the perioperative period.  Please discontinue Aspirin upon resumption of Plavix.  I also confirmed the patient resides in the state of [place]. As per [facility] Medical Board telemedicine laws, the patient must reside in the state in which the provider is licensed.   Joylene Grapes, NP 07/20/2023, 2:52 PM [facility]

## 2023-07-20 NOTE — Telephone Encounter (Signed)
 I spoke with the patient over the phone and reviewed recent labs.  He has had recent fatigue and worsening severe iron deficiency anemia.  In the past, GI workup has only showed gastritis.  Recent guaiac stool has been negative for the patient.  I agree with repeat EGD.  Okay to hold Plavix up to 5 days before the EGD.   In the past, we have chosen Plavix over aspirin due to less GI irritation.  Unless any GI bleeding source is found, I am okay for him to resume Plavix after the EGD.  Low cardiac risk for upcoming EGD.  I do not think he needs any further cardiac testing before EGD.  Elder Negus, MD

## 2023-07-23 NOTE — Telephone Encounter (Signed)
 I will forward back to preop to confirm if pt still needs tele as per Dr. Rosemary Holms notes:  I spoke with the patient over the phone and reviewed recent labs.  He has had recent fatigue and worsening severe iron deficiency anemia.  In the past, GI workup has only showed gastritis.  Recent guaiac stool has been negative for the patient.  I agree with repeat EGD.  Okay to hold Plavix up to 5 days before the EGD.   In the past, we have chosen Plavix over aspirin due to less GI irritation.  Unless any GI bleeding source is found, I am okay for him to resume Plavix after the EGD.   Low cardiac risk for upcoming EGD.  I do not think he needs any further cardiac testing before EGD.   Elder Negus, MD

## 2023-07-24 NOTE — Telephone Encounter (Signed)
     Primary Cardiologist: Dr. Rosemary Holms  Chart reviewed as part of pre-operative protocol coverage. Given past medical history and time since last visit, based on ACC/AHA guidelines, Macarthur Lorusso would be at acceptable risk for the planned procedure without further cardiovascular testing.   Per Dr. Rosemary Holms "Low cardiac risk for upcoming EGD. I do not think he needs any further cardiac testing before EGD"  I will route this recommendation to the requesting party via Epic fax function and remove from pre-op pool.  Please call with questions.  Nicolas Krause. Evamae Rowen NP-C     07/24/2023, 8:42 AM [facility] [address] Office [phone] Fax [phone]

## 2023-07-30 DIAGNOSIS — N1831 Chronic kidney disease, stage 3a: Secondary | ICD-10-CM | POA: Diagnosis not present

## 2023-07-30 DIAGNOSIS — E611 Iron deficiency: Secondary | ICD-10-CM | POA: Diagnosis not present

## 2023-07-30 DIAGNOSIS — E1122 Type 2 diabetes mellitus with diabetic chronic kidney disease: Secondary | ICD-10-CM | POA: Diagnosis not present

## 2023-08-01 DIAGNOSIS — K317 Polyp of stomach and duodenum: Secondary | ICD-10-CM | POA: Diagnosis not present

## 2023-08-01 DIAGNOSIS — D509 Iron deficiency anemia, unspecified: Secondary | ICD-10-CM | POA: Diagnosis not present

## 2023-08-01 DIAGNOSIS — K31819 Angiodysplasia of stomach and duodenum without bleeding: Secondary | ICD-10-CM | POA: Diagnosis not present

## 2023-08-02 ENCOUNTER — Telehealth: Payer: Self-pay | Admitting: Cardiology

## 2023-08-02 DIAGNOSIS — I25118 Atherosclerotic heart disease of native coronary artery with other forms of angina pectoris: Secondary | ICD-10-CM

## 2023-08-02 NOTE — Telephone Encounter (Signed)
 Reviewed EGD report that showed gastroesophageal flap valve, multiple gastric and fundic gland polyps, 2 nonbleeding angioectasias in the antrum.  GI recommended repeat upper endoscopy in 4 weeks for treatment.  Discussed with the patient. He has had gastric intolerance to Aspirin and will resume Plavix and hold for 5 days before next EGD, as long as okay with GI Dr. Loreta [address].  Elder Negus, MD

## 2023-08-06 NOTE — Progress Notes (Signed)
 Gastric polyps it looks like

## 2023-08-06 NOTE — Progress Notes (Signed)
 Yes, I have been in touch with him.  Thanks [employer]

## 2023-08-21 DIAGNOSIS — E611 Iron deficiency: Secondary | ICD-10-CM | POA: Diagnosis not present

## 2023-08-21 DIAGNOSIS — N1831 Chronic kidney disease, stage 3a: Secondary | ICD-10-CM | POA: Diagnosis not present

## 2023-08-21 DIAGNOSIS — E1122 Type 2 diabetes mellitus with diabetic chronic kidney disease: Secondary | ICD-10-CM | POA: Diagnosis not present

## 2023-08-27 DIAGNOSIS — E113293 Type 2 diabetes mellitus with mild nonproliferative diabetic retinopathy without macular edema, bilateral: Secondary | ICD-10-CM | POA: Diagnosis not present

## 2023-09-07 DIAGNOSIS — I2510 Atherosclerotic heart disease of native coronary artery without angina pectoris: Secondary | ICD-10-CM | POA: Diagnosis not present

## 2023-09-07 DIAGNOSIS — E611 Iron deficiency: Secondary | ICD-10-CM | POA: Diagnosis not present

## 2023-09-07 DIAGNOSIS — N1831 Chronic kidney disease, stage 3a: Secondary | ICD-10-CM | POA: Diagnosis not present

## 2023-09-07 DIAGNOSIS — E1122 Type 2 diabetes mellitus with diabetic chronic kidney disease: Secondary | ICD-10-CM | POA: Diagnosis not present

## 2023-09-12 ENCOUNTER — Encounter (HOSPITAL_COMMUNITY): Payer: Self-pay | Admitting: Gastroenterology

## 2023-09-12 ENCOUNTER — Encounter (HOSPITAL_COMMUNITY)
Admission: RE | Disposition: A | Discharge status: Home / Self Care | Payer: Self-pay | Source: Home / Self Care | Attending: Gastroenterology

## 2023-09-12 ENCOUNTER — Ambulatory Visit (HOSPITAL_COMMUNITY)
Admission: RE | Admit: 2023-09-12 | Discharge: 2023-09-12 | Disposition: A | Discharge status: Home / Self Care | Attending: Gastroenterology | Admitting: Gastroenterology

## 2023-09-12 DIAGNOSIS — Z8719 Personal history of other diseases of the digestive system: Secondary | ICD-10-CM | POA: Insufficient documentation

## 2023-09-12 DIAGNOSIS — D509 Iron deficiency anemia, unspecified: Secondary | ICD-10-CM | POA: Insufficient documentation

## 2023-09-12 HISTORY — PX: GIVENS CAPSULE STUDY: SHX5432

## 2023-09-12 SURGERY — IMAGING PROCEDURE, GI TRACT, INTRALUMINAL, VIA CAPSULE

## 2023-09-12 SURGICAL SUPPLY — 1 items: TOWEL COTTON PACK 4EA (MISCELLANEOUS) ×2 IMPLANT

## 2023-09-12 NOTE — Progress Notes (Signed)
 [facility] capsule endoscopy ordered by MD Tova Fresh.  Patient ingested capsule at 0759.  Per Given's capsule instructions, patient to remain NPO until 0959 at which time they may progress to clear liquid diet. At 1159 patient may have a small snack such as a half a sandwich or a bowl of soup. At 1559 patient may progress to previously ordered diet.  The capsule endoscopy study will conclude at 1559 at which time the recorder and belt may be removed.  Pt to return recorder and belt between 1615 and 1630 today.  Instructions given to pt. Pt demonstrates understanding.  Kraig Peru, RN 09/12/23 8:06 AM

## 2023-09-13 ENCOUNTER — Encounter (HOSPITAL_COMMUNITY): Payer: Self-pay | Admitting: Gastroenterology

## 2023-09-17 DIAGNOSIS — D509 Iron deficiency anemia, unspecified: Secondary | ICD-10-CM | POA: Diagnosis not present

## 2023-09-24 DIAGNOSIS — E1122 Type 2 diabetes mellitus with diabetic chronic kidney disease: Secondary | ICD-10-CM | POA: Diagnosis not present

## 2023-09-24 DIAGNOSIS — Z8719 Personal history of other diseases of the digestive system: Secondary | ICD-10-CM | POA: Diagnosis not present

## 2023-09-26 ENCOUNTER — Encounter: Payer: Self-pay | Admitting: Cardiology

## 2023-09-26 DIAGNOSIS — K317 Polyp of stomach and duodenum: Secondary | ICD-10-CM | POA: Diagnosis not present

## 2023-09-26 DIAGNOSIS — K2282 Esophagogastric junction polyp: Secondary | ICD-10-CM | POA: Diagnosis not present

## 2023-09-26 DIAGNOSIS — D509 Iron deficiency anemia, unspecified: Secondary | ICD-10-CM | POA: Diagnosis not present

## 2023-09-26 DIAGNOSIS — K219 Gastro-esophageal reflux disease without esophagitis: Secondary | ICD-10-CM | POA: Diagnosis not present

## 2023-10-16 DIAGNOSIS — E611 Iron deficiency: Secondary | ICD-10-CM | POA: Diagnosis not present

## 2023-10-16 DIAGNOSIS — I2510 Atherosclerotic heart disease of native coronary artery without angina pectoris: Secondary | ICD-10-CM | POA: Diagnosis not present

## 2023-10-16 DIAGNOSIS — N1831 Chronic kidney disease, stage 3a: Secondary | ICD-10-CM | POA: Diagnosis not present

## 2023-10-16 DIAGNOSIS — E1122 Type 2 diabetes mellitus with diabetic chronic kidney disease: Secondary | ICD-10-CM | POA: Diagnosis not present

## 2023-10-18 DIAGNOSIS — N1831 Chronic kidney disease, stage 3a: Secondary | ICD-10-CM | POA: Diagnosis not present

## 2023-10-18 DIAGNOSIS — I129 Hypertensive chronic kidney disease with stage 1 through stage 4 chronic kidney disease, or unspecified chronic kidney disease: Secondary | ICD-10-CM | POA: Diagnosis not present

## 2023-10-18 DIAGNOSIS — N2581 Secondary hyperparathyroidism of renal origin: Secondary | ICD-10-CM | POA: Diagnosis not present

## 2023-10-18 DIAGNOSIS — D631 Anemia in chronic kidney disease: Secondary | ICD-10-CM | POA: Diagnosis not present

## 2024-02-08 DIAGNOSIS — N1831 Chronic kidney disease, stage 3a: Secondary | ICD-10-CM | POA: Diagnosis not present

## 2024-02-08 DIAGNOSIS — I2510 Atherosclerotic heart disease of native coronary artery without angina pectoris: Secondary | ICD-10-CM | POA: Diagnosis not present

## 2024-02-08 DIAGNOSIS — E1122 Type 2 diabetes mellitus with diabetic chronic kidney disease: Secondary | ICD-10-CM | POA: Diagnosis not present

## 2024-03-04 DIAGNOSIS — E1122 Type 2 diabetes mellitus with diabetic chronic kidney disease: Secondary | ICD-10-CM | POA: Diagnosis not present

## 2024-03-04 DIAGNOSIS — R112 Nausea with vomiting, unspecified: Secondary | ICD-10-CM | POA: Diagnosis not present

## 2024-03-04 DIAGNOSIS — N1832 Chronic kidney disease, stage 3b: Secondary | ICD-10-CM | POA: Diagnosis not present

## 2024-04-15 ENCOUNTER — Other Ambulatory Visit: Payer: Self-pay | Admitting: Cardiology

## 2024-05-20 ENCOUNTER — Other Ambulatory Visit: Payer: Self-pay | Admitting: Cardiology

## 2024-05-20 DIAGNOSIS — I10 Essential (primary) hypertension: Secondary | ICD-10-CM

## 2024-05-27 ENCOUNTER — Other Ambulatory Visit: Payer: Self-pay | Admitting: Cardiology

## 2024-05-29 ENCOUNTER — Other Ambulatory Visit: Payer: Self-pay | Admitting: Cardiology

## 2024-05-29 DIAGNOSIS — I10 Essential (primary) hypertension: Secondary | ICD-10-CM

## 2024-06-03 NOTE — Telephone Encounter (Signed)
 Labs overdue. Pt can receive at April F/U.

## 2024-06-12 ENCOUNTER — Other Ambulatory Visit (HOSPITAL_COMMUNITY): Payer: Self-pay

## 2024-08-21 ENCOUNTER — Ambulatory Visit: Admitting: Cardiology
